# Patient Record
Sex: Male | Born: 1980
Health system: Southern US, Community
[De-identification: ages and names within clinical notes are randomized; demographics above are authoritative.]

## PROBLEM LIST (undated history)

## (undated) DIAGNOSIS — F329 Major depressive disorder, single episode, unspecified: Secondary | ICD-10-CM

## (undated) DIAGNOSIS — F419 Anxiety disorder, unspecified: Secondary | ICD-10-CM

## (undated) DIAGNOSIS — F32A Depression, unspecified: Secondary | ICD-10-CM

## (undated) HISTORY — PX: ABDOMINAL SURGERY: SHX537

---

## 2018-01-28 ENCOUNTER — Ambulatory Visit (HOSPITAL_COMMUNITY)
Admission: EM | Admit: 2018-01-28 | Discharge: 2018-01-28 | Disposition: A | Discharge status: Home / Self Care | Payer: BLUE CROSS/BLUE SHIELD | Attending: Family Medicine | Admitting: Family Medicine

## 2018-01-28 ENCOUNTER — Encounter (HOSPITAL_COMMUNITY): Payer: Self-pay | Admitting: Emergency Medicine

## 2018-01-28 DIAGNOSIS — H6123 Impacted cerumen, bilateral: Secondary | ICD-10-CM | POA: Diagnosis not present

## 2018-01-28 NOTE — Discharge Instructions (Signed)
Cerumen removed today. If continues to have some ringing/ear fullness, you can try over the counter flonase/zyrtec for possible eustachian tube dysfunction. Can follow up with ENT for further evaluation if continues to have ringing in the ears.

## 2018-01-28 NOTE — ED Provider Notes (Signed)
[facility]    CSN: [id number] Arrival date & time: 01/28/18  1658     History   Chief Complaint Chief Complaint  Patient presents with  . Otalgia    HPI Stephen Cruz is a 37 y.o. male.   37 year old male comes in for 1 week history of left ear fullness.  Has also had many years of intermittent tinnitus.  Denies ear drainage, pain.  Did try using cotton swabs to clean the ear once symptoms started.  Denies  recent swimming, travels.  Denies URI symptoms such as cough, congestion, rhinorrhea.  Tried over-the-counter eardrops without relief.     History reviewed. No pertinent past medical history.  There are no active problems to display for this patient.   Past Surgical History:  Procedure Laterality Date  . ABDOMINAL SURGERY         Home Medications    Prior to Admission medications   Not on File    Family History History reviewed. No pertinent family history.  Social History Social History   Tobacco Use  . Smoking status: Not on file  Substance Use Topics  . Alcohol use: Not Currently    Frequency: Never  . Drug use: Never     Allergies   Patient has no known allergies.   Review of Systems Review of Systems  Reason unable to perform ROS: See HPI as above.     Physical Exam Triage Vital Signs ED Triage Vitals [01/28/18 1734]  Enc Vitals Group     BP 128/81     Pulse Rate (!) 53     Resp 18     Temp 98.1 F (36.7 C)     Temp src      SpO2 100 %     Weight      Height      Head Circumference      Peak Flow      Pain Score      Pain Loc      Pain Edu?      Excl. in GC?    No data found.  Updated Vital Signs BP 128/81   Pulse (!) 53   Temp 98.1 F (36.7 C)   Resp 18   SpO2 100%   Physical Exam  Constitutional: He is oriented to person, place, and time. He appears well-developed and well-nourished. No distress.  HENT:  Head: Normocephalic and atraumatic.  Right Ear: External ear and ear canal normal.    Left Ear: External ear and ear canal normal.  No tenderness to palpation of bilateral tragus.  Bilateral cerumen impaction, TM not visible.  TM pearly gray, no mid ear effusion, erythema, bulging post irrigation.  Eyes: Pupils are equal, round, and reactive to light. Conjunctivae are normal.  Neurological: He is alert and oriented to person, place, and time.     UC Treatments / Results  Labs (all labs ordered are listed, but only abnormal results are displayed) Labs Reviewed - No data to display  EKG None Radiology No results found.  Procedures Procedures (including critical care time)  Medications Ordered in UC Medications - No data to display   Initial Impression / Assessment and Plan / UC Course  I have reviewed the triage vital signs and the nursing notes.  Pertinent labs & imaging results that were available during my care of the patient were reviewed by me and considered in my medical decision making (see chart for details).    Patient with improved symptoms  after cerumen removal.  Patient can try Flonase/Zyrtec for possible eustachian tube dysfunction for tinnitus.  Otherwise follow-up with ENT for further evaluation needed.  Final Clinical Impressions(s) / UC Diagnoses   Final diagnoses:  Bilateral impacted cerumen    ED Discharge Orders    None        Lurline IdolYu, Chino Sardo V, PA-C 01/28/18 1834

## 2018-01-28 NOTE — ED Triage Notes (Signed)
Pt c/o ringing in his L ear and it feeling "stuffed up" x1 week.

## 2018-06-23 DIAGNOSIS — H2000 Unspecified acute and subacute iridocyclitis: Secondary | ICD-10-CM | POA: Diagnosis not present

## 2018-06-28 DIAGNOSIS — S61310A Laceration without foreign body of right index finger with damage to nail, initial encounter: Secondary | ICD-10-CM | POA: Diagnosis not present

## 2018-07-01 DIAGNOSIS — H2000 Unspecified acute and subacute iridocyclitis: Secondary | ICD-10-CM | POA: Diagnosis not present

## 2018-07-08 DIAGNOSIS — H2000 Unspecified acute and subacute iridocyclitis: Secondary | ICD-10-CM | POA: Diagnosis not present

## 2018-07-15 DIAGNOSIS — H2000 Unspecified acute and subacute iridocyclitis: Secondary | ICD-10-CM | POA: Diagnosis not present

## 2018-08-12 DIAGNOSIS — H2000 Unspecified acute and subacute iridocyclitis: Secondary | ICD-10-CM | POA: Diagnosis not present

## 2018-08-20 DIAGNOSIS — K5900 Constipation, unspecified: Secondary | ICD-10-CM | POA: Diagnosis not present

## 2018-09-01 ENCOUNTER — Ambulatory Visit
Admission: RE | Admit: 2018-09-01 | Discharge: 2018-09-01 | Disposition: A | Discharge status: Home / Self Care | Payer: BLUE CROSS/BLUE SHIELD | Source: Ambulatory Visit | Attending: Gastroenterology | Admitting: Gastroenterology

## 2018-09-01 ENCOUNTER — Other Ambulatory Visit: Payer: Self-pay | Admitting: Gastroenterology

## 2018-09-01 DIAGNOSIS — K219 Gastro-esophageal reflux disease without esophagitis: Secondary | ICD-10-CM | POA: Diagnosis not present

## 2018-09-01 DIAGNOSIS — Z8719 Personal history of other diseases of the digestive system: Secondary | ICD-10-CM

## 2018-09-01 DIAGNOSIS — R140 Abdominal distension (gaseous): Secondary | ICD-10-CM | POA: Diagnosis not present

## 2018-09-01 DIAGNOSIS — R198 Other specified symptoms and signs involving the digestive system and abdomen: Secondary | ICD-10-CM | POA: Diagnosis not present

## 2018-09-01 DIAGNOSIS — K5900 Constipation, unspecified: Secondary | ICD-10-CM | POA: Diagnosis not present

## 2018-09-03 ENCOUNTER — Emergency Department (HOSPITAL_COMMUNITY): Payer: BLUE CROSS/BLUE SHIELD

## 2018-09-03 ENCOUNTER — Other Ambulatory Visit: Payer: Self-pay

## 2018-09-03 ENCOUNTER — Emergency Department (HOSPITAL_COMMUNITY)
Admission: EM | Admit: 2018-09-03 | Discharge: 2018-09-03 | Disposition: A | Discharge status: Home / Self Care | Payer: BLUE CROSS/BLUE SHIELD | Attending: Emergency Medicine | Admitting: Emergency Medicine

## 2018-09-03 DIAGNOSIS — F419 Anxiety disorder, unspecified: Secondary | ICD-10-CM | POA: Insufficient documentation

## 2018-09-03 DIAGNOSIS — R1013 Epigastric pain: Secondary | ICD-10-CM | POA: Diagnosis not present

## 2018-09-03 DIAGNOSIS — R52 Pain, unspecified: Secondary | ICD-10-CM | POA: Diagnosis not present

## 2018-09-03 DIAGNOSIS — R110 Nausea: Secondary | ICD-10-CM | POA: Diagnosis not present

## 2018-09-03 DIAGNOSIS — R61 Generalized hyperhidrosis: Secondary | ICD-10-CM | POA: Diagnosis not present

## 2018-09-03 DIAGNOSIS — R002 Palpitations: Secondary | ICD-10-CM | POA: Diagnosis not present

## 2018-09-03 DIAGNOSIS — R072 Precordial pain: Secondary | ICD-10-CM

## 2018-09-03 DIAGNOSIS — M79603 Pain in arm, unspecified: Secondary | ICD-10-CM | POA: Diagnosis not present

## 2018-09-03 DIAGNOSIS — M79602 Pain in left arm: Secondary | ICD-10-CM | POA: Insufficient documentation

## 2018-09-03 DIAGNOSIS — R079 Chest pain, unspecified: Secondary | ICD-10-CM | POA: Diagnosis not present

## 2018-09-03 LAB — BASIC METABOLIC PANEL
ANION GAP: 8 (ref 5–15)
BUN: 12 mg/dL (ref 6–20)
CALCIUM: 10 mg/dL (ref 8.9–10.3)
CO2: 27 mmol/L (ref 22–32)
CREATININE: 1.06 mg/dL (ref 0.61–1.24)
Chloride: 105 mmol/L (ref 98–111)
Glucose, Bld: 104 mg/dL — ABNORMAL HIGH (ref 70–99)
Potassium: 3.6 mmol/L (ref 3.5–5.1)
SODIUM: 140 mmol/L (ref 135–145)

## 2018-09-03 LAB — CBC
HCT: 44.8 % (ref 39.0–52.0)
Hemoglobin: 14.8 g/dL (ref 13.0–17.0)
MCH: 29.5 pg (ref 26.0–34.0)
MCHC: 33 g/dL (ref 30.0–36.0)
MCV: 89.4 fL (ref 80.0–100.0)
NRBC: 0 % (ref 0.0–0.2)
PLATELETS: 245 10*3/uL (ref 150–400)
RBC: 5.01 MIL/uL (ref 4.22–5.81)
RDW: 11.9 % (ref 11.5–15.5)
WBC: 7.2 10*3/uL (ref 4.0–10.5)

## 2018-09-03 LAB — I-STAT TROPONIN, ED
TROPONIN I, POC: 0 ng/mL (ref 0.00–0.08)
TROPONIN I, POC: 0 ng/mL (ref 0.00–0.08)

## 2018-09-03 NOTE — ED Provider Notes (Signed)
[facility] EMERGENCY DEPARTMENT Provider Note   CSN: [id number] Arrival date & time: 09/03/18  0159     History   Chief Complaint Chief Complaint  Patient presents with  . Chest Pain    HPI Gatlin Kittell is a 37 y.o. male.  The history is provided by the patient.  Chest Pain   This is a new problem. The current episode started 1 to 2 hours ago. The problem occurs constantly. The problem has been gradually improving. The pain is present in the substernal region. The pain is moderate. The quality of the pain is described as burning. The pain radiates to the epigastrium. Associated symptoms include abdominal pain, diaphoresis, nausea and palpitations. Pertinent negatives include no shortness of breath, no syncope and no vomiting. He has tried nothing for the symptoms.  Pertinent negatives for past medical history include no CAD and no PE.   PT Presents with chest and abdominal pain.  He reports waking up with abdominal and chest burning.  He reports at times he has pain in his left arm.  He has had these episodes intermittent for several years, but tonight's episode seems worse and he had mild diaphoresis.  He reports it would usually improve with time.  He just recently got started on a PPI.  He reports about 2 years ago he had a stress test that was negative, but this is when he lived in [place] He has no other significant medical conditions.  PMH-GERD, anxiety Soc hx - nonsmoker Past Surgical History:  Procedure Laterality Date  . ABDOMINAL SURGERY          Home Medications    Prior to Admission medications   Not on File    Family History No family history on file.  Social History Social History   Tobacco Use  . Smoking status: Not on file  Substance Use Topics  . Alcohol use: Not Currently    Frequency: Never  . Drug use: Never     Allergies   Patient has no known allergies.   Review of Systems Review of Systems  Constitutional:  Positive for diaphoresis.  Respiratory: Negative for shortness of breath.   Cardiovascular: Positive for chest pain and palpitations. Negative for syncope.  Gastrointestinal: Positive for abdominal pain and nausea. Negative for vomiting.  Neurological: Negative for syncope.  Psychiatric/Behavioral: The patient is nervous/anxious.   All other systems reviewed and are negative.    Physical Exam Updated Vital Signs BP 127/82   Pulse 73   Temp 97.6 F (36.4 C) (Oral)   Resp 16   Ht 1.778 m (5\' 10" )   Wt 79.4 kg   SpO2 100%   BMI 25.11 kg/m   Physical Exam CONSTITUTIONAL: Well developed/well nourished HEAD: Normocephalic/atraumatic EYES: EOMI/PERRL ENMT: Mucous membranes moist NECK: supple no meningeal signs SPINE/BACK:entire spine nontender CV: S1/S2 noted, no murmurs/rubs/gallops noted LUNGS: Lungs are clear to auscultation bilaterally, no apparent distress ABDOMEN: soft, nontender, no rebound or guarding, bowel sounds noted throughout abdomen GU:no cva tenderness NEURO: Pt is awake/alert/appropriate, moves all extremitiesx4.  No facial droop.   EXTREMITIES: pulses normal/equal, full ROM, no LE edema or tenderness SKIN: warm, color normal PSYCH: anxious   ED Treatments / Results  Labs (all labs ordered are listed, but only abnormal results are displayed) Labs Reviewed  BASIC METABOLIC PANEL - Abnormal; Notable for the following components:      Result Value   Glucose, Bld 104 (*)    All other components within normal  limits  CBC  I-STAT TROPONIN, ED  I-STAT TROPONIN, ED    EKG EKG Interpretation  Date/Time:  Saturday September 03 2018 02:07:45 EST Ventricular Rate:  73 PR Interval:    QRS Duration: 108 QT Interval:  385 QTC Calculation: 425 R Axis:   87 Text Interpretation:  Sinus arrhythmia Confirmed by Zadie RhineWickline, Daizy Outen ([id number]) on 09/03/2018 2:24:08 AM   Radiology Dg Abd 2 Views  Result Date: 09/02/2018 CLINICAL DATA:  Nausea, constipation. History  of small-bowel obstruction. EXAM: ABDOMEN - 2 VIEW COMPARISON:  None. FINDINGS: Bowel gas pattern is nonobstructive. No plain film evidence of constipation No evidence of soft tissue mass or abnormal fluid collection. No evidence of free intraperitoneal air. No evidence of renal or ureteral calculi. Lung bases appear clear. Osseous structures are unremarkable. IMPRESSION: Negative. Nonobstructive bowel gas pattern. No radiographic evidence of constipation. Electronically Signed   By: Bary RichardStan  Maynard M.D.   On: 09/02/2018 09:07    Procedures Procedures (including critical care time)  Medications Ordered in ED Medications - No data to display   Initial Impression / Assessment and Plan / ED Course  I have reviewed the triage vital signs and the nursing notes.  Pertinent labs & imaging results that were available during my care of the patient were reviewed by me and considered in my medical decision making (see chart for details).     2:28 AM HEART Score less than 3 Imaging/labs pending at this time 5:05 AM Repeat troponin negative.  X-ray negative.  EKG unremarkable.  My suspicion for ACS/PE is low.  Will refer to cardiology for palpitations. Patient well-appearing and feels improved. Final Clinical Impressions(s) / ED Diagnoses   Final diagnoses:  Precordial pain  Palpitations    ED Discharge Orders    None       Zadie RhineWickline, Roseana Rhine, MD 09/03/18 [phone]

## 2018-09-03 NOTE — Discharge Instructions (Addendum)

## 2018-09-03 NOTE — ED Triage Notes (Addendum)
Pt reports centralized chest pain that goes into his stomach described as buring.  Pt reports the pain woke him up and he also has left arm pain.  denies sob, dizziness or sweating.  Reports this has been going on "for years but is really bad."

## 2018-09-03 NOTE — ED Notes (Signed)
ED Provider at bedside. 

## 2018-09-21 ENCOUNTER — Encounter (HOSPITAL_COMMUNITY): Payer: Self-pay

## 2018-09-21 ENCOUNTER — Emergency Department (HOSPITAL_COMMUNITY)
Admission: EM | Admit: 2018-09-21 | Discharge: 2018-09-21 | Disposition: A | Discharge status: Home / Self Care | Payer: BLUE CROSS/BLUE SHIELD | Attending: Emergency Medicine | Admitting: Emergency Medicine

## 2018-09-21 ENCOUNTER — Emergency Department (HOSPITAL_COMMUNITY): Payer: BLUE CROSS/BLUE SHIELD

## 2018-09-21 DIAGNOSIS — R079 Chest pain, unspecified: Secondary | ICD-10-CM | POA: Diagnosis not present

## 2018-09-21 LAB — BASIC METABOLIC PANEL
ANION GAP: 6 (ref 5–15)
BUN: 14 mg/dL (ref 6–20)
CHLORIDE: 105 mmol/L (ref 98–111)
CO2: 29 mmol/L (ref 22–32)
Calcium: 9.5 mg/dL (ref 8.9–10.3)
Creatinine, Ser: 1.22 mg/dL (ref 0.61–1.24)
GFR calc Af Amer: >60 mL/min (ref >60)
GFR calc non Af Amer: >60 mL/min (ref >60)
GLUCOSE: 112 mg/dL — AB (ref 70–99)
POTASSIUM: 3.8 mmol/L (ref 3.5–5.1)
Sodium: 140 mmol/L (ref 135–145)

## 2018-09-21 LAB — I-STAT TROPONIN, ED
TROPONIN I, POC: 0.01 ng/mL (ref 0.00–0.08)
Troponin i, poc: 0 ng/mL (ref 0.00–0.08)

## 2018-09-21 LAB — CBC
HEMATOCRIT: 43.3 % (ref 39.0–52.0)
HEMOGLOBIN: 14.1 g/dL (ref 13.0–17.0)
MCH: 29.1 pg (ref 26.0–34.0)
MCHC: 32.6 g/dL (ref 30.0–36.0)
MCV: 89.5 fL (ref 80.0–100.0)
NRBC: 0 % (ref 0.0–0.2)
Platelets: 269 10*3/uL (ref 150–400)
RBC: 4.84 MIL/uL (ref 4.22–5.81)
RDW: 11.9 % (ref 11.5–15.5)
WBC: 7.5 10*3/uL (ref 4.0–10.5)

## 2018-09-21 NOTE — ED Notes (Signed)
Pt stable and ambulatory for discharge, states understanding follow up.  

## 2018-09-21 NOTE — ED Triage Notes (Signed)
Pt states that he began to have CP and L arm pain after lifting jugs of water today. He also became nauseated.

## 2018-09-21 NOTE — Discharge Instructions (Signed)
Follow up with cardiology as scheduled this Friday   Return to ER if you have worse chest pain, trouble breathing

## 2018-09-22 NOTE — ED Provider Notes (Signed)
[facility] EMERGENCY DEPARTMENT Provider Note   CSN: [id number] Arrival date & time: 09/21/18  1923     History   Chief Complaint Chief Complaint  Patient presents with  . Chest Pain    HPI Stephen Cruz is a 37 y.o. male here with palpitations, left sided chest pain, arm pain. Patient was lifting some water jugs this afternoon and then began having left sided chest pain that radiate down to the left arm. Felt nauseated. Pain is crampy in nature. Patient had some associated palpitations. Of note, patient was seen in the ED several weeks ago for palpitations and chest pain and troponins were negative. Patient is scheduled to see cardiology in 2 days for further evaluation and possible Holter monitor. He had negative stress test 2 years ago. Denies any recent travel or hx of blood clots or CAD.   The history is provided by the patient.    History reviewed. No pertinent past medical history.  There are no active problems to display for this patient.   Past Surgical History:  Procedure Laterality Date  . ABDOMINAL SURGERY          Home Medications    Prior to Admission medications   Not on File    Family History No family history on file.  Social History Social History   Tobacco Use  . Smoking status: Not on file  Substance Use Topics  . Alcohol use: Not Currently    Frequency: Never  . Drug use: Never     Allergies   Patient has no known allergies.   Review of Systems Review of Systems  Cardiovascular: Positive for chest pain.  All other systems reviewed and are negative.    Physical Exam Updated Vital Signs BP 117/70   Pulse (!) 56   Temp 97.9 F (36.6 C) (Oral)   Resp 15   SpO2 100%   Physical Exam  Constitutional: He is oriented to person, place, and time. He appears well-developed and well-nourished.  Slightly anxious   HENT:  Head: Normocephalic.  Eyes: Pupils are equal, round, and reactive to light. EOM are  normal.  Neck: Normal range of motion. Neck supple.  Cardiovascular: Normal rate, regular rhythm, intact distal pulses and normal pulses.  Pulmonary/Chest: Effort normal and breath sounds normal.  No obvious reproducible tenderness   Abdominal: Soft. Bowel sounds are normal.  Musculoskeletal: Normal range of motion.       Right lower leg: Normal. He exhibits no tenderness and no edema.       Left lower leg: Normal. He exhibits no tenderness and no edema.  Neurological: He is alert and oriented to person, place, and time.  Skin: Skin is warm. Capillary refill takes less than 2 seconds.  Psychiatric: He has a normal mood and affect. His behavior is normal.  Nursing note and vitals reviewed.    ED Treatments / Results  Labs (all labs ordered are listed, but only abnormal results are displayed) Labs Reviewed  BASIC METABOLIC PANEL - Abnormal; Notable for the following components:      Result Value   Glucose, Bld 112 (*)    All other components within normal limits  CBC  I-STAT TROPONIN, ED  I-STAT TROPONIN, ED    EKG EKG Interpretation  Date/Time:  Wednesday September 21 2018 19:34:59 EST Ventricular Rate:  75 PR Interval:  140 QRS Duration: 86 QT Interval:  368 QTC Calculation: 410 R Axis:   88 Text Interpretation:  Normal sinus rhythm  with sinus arrhythmia Nonspecific ST abnormality Abnormal ECG No significant change since last tracing Confirmed by Richardean CanalYao, Charlie Seda H [phone]([id number]) on 09/21/2018 8:56:19 PM Also confirmed by Richardean CanalYao, Kaileigh Viswanathan H [phone]), editor Elita QuickWatlington, [place] [phone])  on 09/22/2018 7:31:49 AM   Radiology Dg Chest 2 View  Result Date: 09/21/2018 CLINICAL DATA:  37 year old male with chest pain. EXAM: CHEST - 2 VIEW COMPARISON:  Chest radiograph dated 09/03/2018 FINDINGS: The heart size and mediastinal contours are within normal limits. Both lungs are clear. The visualized skeletal structures are unremarkable. IMPRESSION: No active cardiopulmonary disease. Electronically Signed    By: Elgie CollardArash  Radparvar M.D.   On: 09/21/2018 21:10    Procedures Procedures (including critical care time)  Medications Ordered in ED Medications - No data to display   Initial Impression / Assessment and Plan / ED Course  I have reviewed the triage vital signs and the nursing notes.  Pertinent labs & imaging results that were available during my care of the patient were reviewed by me and considered in my medical decision making (see chart for details).    Stephen Cruz is a 37 y.o. male here with left sided chest pain, arm pain after lifting water jugs. I think likely muscle strain but pain is not reproducible. Patient also has hx of reflux as well with similar symptoms. Low risk for ACS and patient had a r/o ACS several weeks ago in the ED and has cardiology appointment. I doubt PE or dissection. Will get labs, trop x 2, CXR.   10:30 pm Patient's labs, CXR unremarkable. Trop neg x 2. I offered NSAIDs but he states that he has reflux and it will upset his stomach. Has cardiology follow up in 2 days. Stable for discharge.    Final Clinical Impressions(s) / ED Diagnoses   Final diagnoses:  Chest pain, unspecified type    ED Discharge Orders    None       Charlynne PanderYao, Rogue Pautler Hsienta, MD 09/22/18 2027

## 2018-09-23 ENCOUNTER — Encounter: Payer: Self-pay | Admitting: Cardiology

## 2018-09-23 ENCOUNTER — Ambulatory Visit (INDEPENDENT_AMBULATORY_CARE_PROVIDER_SITE_OTHER): Payer: BLUE CROSS/BLUE SHIELD | Admitting: Cardiology

## 2018-09-23 VITALS — BP 128/56 | HR 66 | Ht 70.0 in | Wt 167.8 lb

## 2018-09-23 DIAGNOSIS — R002 Palpitations: Secondary | ICD-10-CM | POA: Insufficient documentation

## 2018-09-23 DIAGNOSIS — R0789 Other chest pain: Secondary | ICD-10-CM

## 2018-09-23 NOTE — Patient Instructions (Signed)
Medication Instructions:  Your physician recommends that you continue on your current medications as directed. Please refer to the Current Medication list given to you today.  If you need a refill on your cardiac medications before your next appointment, please call your pharmacy.   Lab work: Your physician recommends that you return for lab work today: TSH If you have labs (blood work) drawn today and your tests are completely normal, you will receive your results only by: Marland Kitchen MyChart Message (if you have MyChart) OR . A paper copy in the mail If you have any lab test that is abnormal or we need to change your treatment, we will call you to review the results.  Testing/Procedures: Your physician has requested that you have a stress echocardiogram. For further information please visit [email or web address] Please follow instruction sheet as given.  Your physician has recommended that you wear a holter monitor. Holter monitors are medical devices that record the heart's electrical activity. Doctors most often use these monitors to diagnose arrhythmias. Arrhythmias are problems with the speed or rhythm of the heartbeat. The monitor is a small, portable device. You can wear one while you do your normal daily activities. This is usually used to diagnose what is causing palpitations/syncope (passing out). Wear for 14 days.     Follow-Up: At [facility], you and your health needs are our priority.  As part of our continuing mission to provide you with exceptional heart care, we have created designated Provider Care Teams.  These Care Teams include your primary Cardiologist (physician) and Advanced Practice Providers (APPs -  Physician Assistants and Nurse Practitioners) who all work together to provide you with the care you need, when you need it. You will need a follow up appointment in 6 months.  Please call our office 2 months in advance to schedule this appointment.  You may see No primary care  provider on file. or another member of our [employer] in [place]: Gypsy Balsam, MD . Norman Herrlich, MD  Any Other Special Instructions Will Be Listed Below (If Applicable).   Exercise Stress Echocardiogram An exercise stress echocardiogram is a test to check how well your heart is working. This test uses sound waves (ultrasound) and a computer to make images of your heart before and after exercise. Ultrasound images that are taken before you exercise (your resting echocardiogram) will show how much blood is getting to your heart muscle and how well your heart muscle and heart valves are functioning. During the next part of this test, you will walk on a treadmill or ride a stationary bike to see how exercise affects your heart. While you exercise, the electrical activity of your heart will be monitored with an electrocardiogram (ECG). Your blood pressure will also be monitored. You may have this test if you:  Have chest pain or other symptoms of a heart problem.  Recently had a heart attack or heart surgery.  Have heart valve problems.  Have a condition that causes narrowing of the blood vessels that supply your heart (coronary artery disease).  Have a high risk of heart disease and are starting a new exercise program.  Have a high risk of heart disease and need to have major surgery.  Tell a health care provider about:  Any allergies you have.  All medicines you are taking, including vitamins, herbs, eye drops, creams, and over-the-counter medicines.  Any problems you or family members have had with anesthetic medicines.  Any blood disorders you  have.  Any surgeries you have had.  Any medical conditions you have.  Whether you are pregnant or may be pregnant. What are the risks? Generally, this is a safe procedure. However, problems may occur, including:  Chest pain.  Dizziness or light-headedness.  Shortness of breath.  Increased or irregular  heartbeat (palpitations).  Nausea or vomiting.  Heart attack (very rare).  What happens before the procedure?  Follow instructions from your health care provider about eating or drinking restrictions. You may be asked to avoid all forms of caffeine for 24 hours before your procedure, or as told by your health care provider.  Ask your health care provider about changing or stopping your regular medicines. This is especially important if you are taking diabetes medicines or blood thinners.  If you use an inhaler, bring it with you to the test.  Wear loose, comfortable clothing and walking shoes.  Do notuse any products that contain nicotine or tobacco, such as cigarettes and e-cigarettes, for 4 hours before the test or as told by your health care provider. If you need help quitting, ask your health care provider. What happens during the procedure?  You will take off your clothes from the waist up and put on a hospital gown.  A technician will place electrodes on your chest.  A blood pressure cuff will be placed on your arm.  You will lie down on a table for an ultrasound exam before you exercise. Gel will be rubbed on your chest, and a handheld device (transducer) will be pressed against your chest and moved over your heart.  Then, you will start exercising by walking on a treadmill or pedaling a stationary bicycle.  Your blood pressure and heart rhythm will be monitored while you exercise.  The exercise will gradually get harder or faster.  You will exercise until: ? Your heart reaches a target level. ? You are too tired to continue. ? You cannot continue because of chest pain, weakness, or dizziness.  You will have another ultrasound exam after you stop exercising. The procedure may vary among health care providers and hospitals. What happens after the procedure?  Your heart rate and blood pressure will be monitored until they return to your normal levels. Summary  An  exercise stress echocardiogram is a test that uses ultrasound to check how well your heart works before and after exercise.  Before the test, follow instructions from your health care provider about stopping medications, avoiding nicotine and tobacco, and avoiding certain foods and drinks.  During the test, your blood pressure and heart rhythm will be monitored while you exercise on a treadmill or stationary bicycle. This information is not intended to replace advice given to you by your health care provider. Make sure you discuss any questions you have with your health care provider. Document Released: 10/09/2004 Document Revised: 05/27/2016 Document Reviewed: 05/27/2016 Elsevier Interactive Patient Education  2018 [employer] Inc.    Holter Monitoring A Holter monitor is a small device that is used to detect abnormal heart rhythms. It clips to your clothing and is connected by wires to flat, sticky disks (electrodes) that attach to your chest. It is worn continuously for 24-48 hours. Follow these instructions at home:  Wear your Holter monitor at all times, even while exercising and sleeping, for as long as directed by your health care provider.  Make sure that the Holter monitor is safely clipped to your clothing or close to your body as recommended by your health care provider.  Do not get the monitor or wires wet.  Do not put body lotion or moisturizer on your chest.  Keep your skin clean.  Keep a diary of your daily activities, such as walking and doing chores. If you feel that your heartbeat is abnormal or that your heart is fluttering or skipping a beat: ? Record what you are doing when it happens. ? Record what time of day the symptoms occur.  Return your Holter monitor as directed by your health care provider.  Keep all follow-up visits as directed by your health care provider. This is important. Get help right away if:  You feel lightheaded or you faint.  You have trouble  breathing.  You feel pain in your chest, upper arm, or jaw.  You feel sick to your stomach and your skin is pale, cool, or damp.  You heartbeat feels unusual or abnormal. This information is not intended to replace advice given to you by your health care provider. Make sure you discuss any questions you have with your health care provider. Document Released: 07/03/2004 Document Revised: 03/12/2016 Document Reviewed: 05/14/2014 Elsevier Interactive Patient Education  [employer]2018 [employer] Inc.

## 2018-09-23 NOTE — Progress Notes (Signed)
Cardiology Office Note:    Date:  09/23/2018   ID:  Stephen Cruz, DOB 12/09/1980, MRN [id number]  PCP:  Patient, No Pcp Per  Cardiologist:  Garwin Brothersajan R Jevonte Clanton, MD   Referring MD: No ref. provider found    ASSESSMENT:    1. Palpitations   2. Chest discomfort    PLAN:    In order of problems listed above:  1. I discussed my findings with the patient at extensive length and primary prevention stressed.  I reassured the patient.  He brought some lab work with him and I reviewed them. 2. Patient's TSH will be checked today in view of his palpitations and his symptoms. 3. He will undergo exercise stress echo and to understand his symptoms.  He will also undergo 14-day event monitoring to understand his palpitations and their significance.  He knows to go to the nearest emergency room for any significant concerns. 4. Patient will be seen in follow-up appointment in 6 months or earlier if the patient has any concerns    Medication Adjustments/Labs and Tests Ordered: Current medicines are reviewed at length with the patient today.  Concerns regarding medicines are outlined above.  No orders of the defined types were placed in this encounter.  No orders of the defined types were placed in this encounter.    History of Present Illness:    Stephen Cruz is a 37 y.o. male who is being seen today for the evaluation of palpitations and chest discomfort.  Patient is a pleasant 37 year old male.  He mentions to me that he has issues with anxiety.  He has history of irritable bowel syndrome and acid reflux disease.  He was referred here for palpitations.  He tells me that he has his palpitations on and off and is been experiencing them since young age.  But recently he mentions to me that he has been having this about once or twice a week and not of concern to him.  No orthopnea PND or syncope.  He occasionally has chest discomfort again not related to exertion and this has no radiation to the  neck or to the arms.  He is not aware of his cholesterol.  At the time of my evaluation, the patient is alert awake oriented and in no distress.  History reviewed. No pertinent past medical history.  Past Surgical History:  Procedure Laterality Date  . ABDOMINAL SURGERY      Current Medications: Current Meds  Medication Sig  . dicyclomine (BENTYL) 10 MG capsule Take 1 capsule by mouth 3 (three) times daily.  . pantoprazole (PROTONIX) 40 MG tablet Take 40 mg by mouth daily.     Allergies:   Patient has no known allergies.   Social History   Socioeconomic History  . Marital status: Single    Spouse name: Not on file  . Number of children: Not on file  . Years of education: Not on file  . Highest education level: Not on file  Occupational History  . Not on file  Social Needs  . Financial resource strain: Not on file  . Food insecurity:    Worry: Not on file    Inability: Not on file  . Transportation needs:    Medical: Not on file    Non-medical: Not on file  Tobacco Use  . Smoking status: Never Smoker  . Smokeless tobacco: Never Used  Substance and Sexual Activity  . Alcohol use: Not Currently    Frequency: Never  . Drug use:  Never  . Sexual activity: Never  Lifestyle  . Physical activity:    Days per week: Not on file    Minutes per session: Not on file  . Stress: Not on file  Relationships  . Social connections:    Talks on phone: Not on file    Gets together: Not on file    Attends religious service: Not on file    Active member of club or organization: Not on file    Attends meetings of clubs or organizations: Not on file    Relationship status: Not on file  Other Topics Concern  . Not on file  Social History Narrative  . Not on file     Family History: The patient's family history is not on file.  ROS:   Please see the history of present illness.    All other systems reviewed and are negative.  EKGs/Labs/Other Studies Reviewed:    The  following studies were reviewed today: EKG reveals sinus rhythm and nonspecific ST changes.   Recent Labs: 09/21/2018: BUN 14; Creatinine, Ser 1.22; Hemoglobin 14.1; Platelets 269; Potassium 3.8; Sodium 140  Recent Lipid Panel No results found for: CHOL, TRIG, HDL, CHOLHDL, VLDL, LDLCALC, LDLDIRECT  Physical Exam:    VS:  BP (!) 128/56 (BP Location: Right Arm, Patient Position: Sitting, Cuff Size: Normal)   Pulse 66   Ht 5\' 10"  (1.778 m)   Wt 167 lb 12.8 oz (76.1 kg)   SpO2 98%   BMI 24.08 kg/m     Wt Readings from Last 3 Encounters:  09/23/18 167 lb 12.8 oz (76.1 kg)  09/03/18 175 lb (79.4 kg)     GEN: Patient is in no acute distress HEENT: Normal NECK: No JVD; No carotid bruits LYMPHATICS: No lymphadenopathy CARDIAC: S1 S2 regular, 2/6 systolic murmur at the apex. RESPIRATORY:  Clear to auscultation without rales, wheezing or rhonchi  ABDOMEN: Soft, non-tender, non-distended MUSCULOSKELETAL:  No edema; No deformity  SKIN: Warm and dry NEUROLOGIC:  Alert and oriented x 3 PSYCHIATRIC:  Normal affect    Signed, Garwin Brothers, MD  09/23/2018 10:00 AM    [facility]

## 2018-09-24 LAB — TSH: TSH: 0.856 u[IU]/mL (ref 0.450–4.500)

## 2018-09-29 ENCOUNTER — Ambulatory Visit: Payer: BLUE CROSS/BLUE SHIELD

## 2018-09-29 ENCOUNTER — Ambulatory Visit (HOSPITAL_BASED_OUTPATIENT_CLINIC_OR_DEPARTMENT_OTHER)
Admission: RE | Admit: 2018-09-29 | Discharge: 2018-09-29 | Disposition: A | Discharge status: Home / Self Care | Payer: BLUE CROSS/BLUE SHIELD | Source: Ambulatory Visit | Attending: Cardiology | Admitting: Cardiology

## 2018-09-29 DIAGNOSIS — R002 Palpitations: Secondary | ICD-10-CM | POA: Diagnosis not present

## 2018-09-29 DIAGNOSIS — R0789 Other chest pain: Secondary | ICD-10-CM | POA: Diagnosis not present

## 2018-09-29 NOTE — Progress Notes (Signed)
  Echocardiogram Echocardiogram Stress Test has been performed.  Stephen Cruz 09/29/2018, 11:15 AM

## 2018-10-04 ENCOUNTER — Other Ambulatory Visit (HOSPITAL_BASED_OUTPATIENT_CLINIC_OR_DEPARTMENT_OTHER): Payer: BLUE CROSS/BLUE SHIELD

## 2018-10-10 DIAGNOSIS — H2000 Unspecified acute and subacute iridocyclitis: Secondary | ICD-10-CM | POA: Diagnosis not present

## 2018-10-17 DIAGNOSIS — R002 Palpitations: Secondary | ICD-10-CM | POA: Diagnosis not present

## 2018-10-21 DIAGNOSIS — H2000 Unspecified acute and subacute iridocyclitis: Secondary | ICD-10-CM | POA: Diagnosis not present

## 2018-10-27 ENCOUNTER — Ambulatory Visit: Payer: BLUE CROSS/BLUE SHIELD | Admitting: Family Medicine

## 2018-10-27 ENCOUNTER — Encounter: Payer: Self-pay | Admitting: Family Medicine

## 2018-10-27 VITALS — BP 126/70 | HR 62 | Temp 97.5°F | Ht 70.0 in | Wt 167.2 lb

## 2018-10-27 DIAGNOSIS — F418 Other specified anxiety disorders: Secondary | ICD-10-CM

## 2018-10-27 DIAGNOSIS — K589 Irritable bowel syndrome without diarrhea: Secondary | ICD-10-CM | POA: Diagnosis not present

## 2018-10-27 DIAGNOSIS — Z0000 Encounter for general adult medical examination without abnormal findings: Secondary | ICD-10-CM | POA: Diagnosis not present

## 2018-10-27 MED ORDER — VENLAFAXINE HCL ER 37.5 MG PO CP24: 37.5000 mg | ORAL_CAPSULE | Freq: Every day | ORAL | 0 refills | Status: DC

## 2018-10-27 NOTE — Progress Notes (Signed)
Established Patient Office Visit  Subjective:  Patient ID: Stephen Cruz, male    DOB: 10/05/1981  Age: 38 y.o. MRN: [id number]  CC:  Chief Complaint  Patient presents with  . Establish Care    HPI Stephen Cruz presents for establishment of care and a discussion of anxiety and depression.  He has been feeling sad anxious since May of this past year.  This roughly coincides with the break-up of his significant other that happen around that time.  He actually moved in this area from [place] a few years ago in order to be with this person.  [place] members [place] in the [place].  Patient currently lives alone.  He works for [employer].  He has a degree in biology.  Does not smoke drink or use illicit drugs.  He is currently not exercising.  His father has a history of bladder cancer.  He is mother is healthy.  Patient is currently under GI care for irritable bowel syndrome.  He is currently being treated by an ophthalmologist for iritis and OS.  Longstanding history of anxiety with some depression.  He was given Zoloft when he was in college this did not agree with him.  He has been depressed and anxious.  Admits to being sad and blue at times has had trouble getting motivated.  Fleeting thoughts that he would be better off not here but is now never seriously thought about hurting himself and is never made a plan for self-harm.  History reviewed. No pertinent past medical history.  Past Surgical History:  Procedure Laterality Date  . ABDOMINAL SURGERY      History reviewed. No pertinent family history.  Social History   Socioeconomic History  . Marital status: Single    Spouse name: Not on file  . Number of children: Not on file  . Years of education: Not on file  . Highest education level: Not on file  Occupational History  . Not on file  Social Needs  . Financial resource strain: Not on file  . Food insecurity:    Worry:  Not on file    Inability: Not on file  . Transportation needs:    Medical: Not on file    Non-medical: Not on file  Tobacco Use  . Smoking status: Never Smoker  . Smokeless tobacco: Never Used  Substance and Sexual Activity  . Alcohol use: Not Currently    Frequency: Never  . Drug use: Never  . Sexual activity: Never  Lifestyle  . Physical activity:    Days per week: Not on file    Minutes per session: Not on file  . Stress: Not on file  Relationships  . Social connections:    Talks on phone: Not on file    Gets together: Not on file    Attends religious service: Not on file    Active member of club or organization: Not on file    Attends meetings of clubs or organizations: Not on file    Relationship status: Not on file  . Intimate partner violence:    Fear of current or ex partner: Not on file    Emotionally abused: Not on file    Physically abused: Not on file    Forced sexual activity: Not on file  Other Topics Concern  . Not on file  Social History Narrative  . Not on file    Outpatient Medications Prior to Visit  Medication Sig Dispense  Refill  . cyclopentolate (CYCLODRYL,CYCLOGYL) 1 % ophthalmic solution 1 drop once.    . pantoprazole (PROTONIX) 40 MG tablet Take 40 mg by mouth daily.    . prednisoLONE acetate (PRED FORTE) 1 % ophthalmic suspension 1 drop. Into affected eye.    . dicyclomine (BENTYL) 10 MG capsule Take 1 capsule by mouth 3 (three) times daily.  0   No facility-administered medications prior to visit.     No Known Allergies  ROS Review of Systems  Constitutional: Positive for unexpected weight change. Negative for fatigue and fever.  HENT: Negative.   Eyes: Positive for pain. Negative for photophobia and visual disturbance.  Respiratory: Negative.   Cardiovascular: Negative.   Gastrointestinal: Negative.   Endocrine: Negative for polyphagia and polyuria.  Genitourinary: Negative.   Musculoskeletal: Negative.   Skin: Negative for  pallor and rash.  Neurological: Negative for light-headedness, numbness and headaches.  Hematological: Does not bruise/bleed easily.  Psychiatric/Behavioral: Positive for dysphoric mood. Negative for self-injury and suicidal ideas. The patient is nervous/anxious.       Objective:    Physical Exam  BP 126/70   Pulse 62   Temp (!) 97.5 F (36.4 C) (Oral)   Ht 5\' 10"  (1.778 m)   Wt 167 lb 4 oz (75.9 kg)   SpO2 99%   BMI 24.00 kg/m  Wt Readings from Last 3 Encounters:  10/27/18 167 lb 4 oz (75.9 kg)  09/23/18 167 lb 12.8 oz (76.1 kg)  09/03/18 175 lb (79.4 kg)   BP Readings from Last 3 Encounters:  10/27/18 126/70  09/23/18 (!) 128/56  09/21/18 117/70   Guideline developer:  UpToDate (see UpToDate for funding source) Date Released: June 2014  Health Maintenance Due  Topic Date Due  . HIV Screening  01/18/1997  . INFLUENZA VACCINE  05/19/2018    There are no preventive care reminders to display for this patient.  Lab Results  Component Value Date   TSH 0.856 09/23/2018   Lab Results  Component Value Date   WBC 7.5 09/21/2018   HGB 14.1 09/21/2018   HCT 43.3 09/21/2018   MCV 89.5 09/21/2018   PLT 269 09/21/2018   Lab Results  Component Value Date   NA 140 09/21/2018   K 3.8 09/21/2018   CO2 29 09/21/2018   GLUCOSE 112 (H) 09/21/2018   BUN 14 09/21/2018   CREATININE 1.22 09/21/2018   CALCIUM 9.5 09/21/2018   ANIONGAP 6 09/21/2018   No results found for: CHOL No results found for: HDL No results found for: LDLCALC No results found for: TRIG No results found for: CHOLHDL No results found for: [id number]    Assessment & Plan:   Problem List Items Addressed This Visit      Digestive   Irritable bowel syndrome     Other   Depression with anxiety - Primary   Relevant Medications   venlafaxine XR (EFFEXOR-XR) 37.5 MG 24 hr capsule   Health care maintenance   Relevant Orders   CBC   Comprehensive metabolic panel   Lipid panel   Urinalysis, Routine  w reflex microscopic      Meds ordered this encounter  Medications  . venlafaxine XR (EFFEXOR-XR) 37.5 MG 24 hr capsule    Sig: Take 1 capsule (37.5 mg total) by mouth daily with breakfast. For one week and then increase to 2 each morning with breakfast.    Dispense:  60 capsule    Refill:  0    Follow-up: Return in about 1  month (around 11/27/2018).   Patient was given information on the anxiety and depression.  He was also given information on Effexor extended release tablets.  He will make an appointment for counseling through employee assistance at his work.  He will return fasting for above ordered blood work.

## 2018-10-27 NOTE — Patient Instructions (Addendum)
Irritable Bowel Syndrome, Adult  Irritable bowel syndrome (IBS) is a group of symptoms that affects the organs responsible for digestion (gastrointestinal or GI tract). IBS is not one specific disease. To regulate how the GI tract works, the body sends signals back and forth between the intestines and the brain. If you have IBS, there may be a problem with these signals. As a result, the GI tract does not function normally. The intestines may become more sensitive and overreact to certain things. This may be especially true when you eat certain foods or when you are under stress. There are four types of IBS. These may be determined based on the consistency of your stool (feces):  IBS with diarrhea.  IBS with constipation.  Mixed IBS.  Unsubtyped IBS. It is important to know which type of IBS you have. Certain treatments are more likely to be helpful for certain types of IBS. What are the causes? The exact cause of IBS is not known. What increases the risk? You may have a higher risk for IBS if you:  Are male.  Are younger than 72.  Have a family history of IBS.  Have a mental health condition, such as depression, anxiety, or post-traumatic stress disorder.  Have had a bacterial infection of your GI tract. What are the signs or symptoms? Symptoms of IBS vary from person to person. The main symptom is abdominal pain or discomfort. Other symptoms usually include one or more of the following:  Diarrhea, constipation, or both.  Abdominal swelling or bloating.  Feeling full after eating a small or regular-sized meal.  Frequent gas.  Mucus in the stool.  A feeling of having more stool left after a bowel movement. Symptoms tend to come and go. They may be triggered by stress, mental health conditions, or certain foods. How is this diagnosed? This condition may be diagnosed based on a physical exam, your medical history, and your symptoms. You may have tests, such as:  Blood  tests.  Stool test.  X-rays.  CT scan.  Colonoscopy. This is a procedure in which your GI tract is viewed with a long, thin, flexible tube. How is this treated? There is no cure for IBS, but treatment can help relieve symptoms. Treatment depends on the type of IBS you have, and may include:  Changes to your diet, such as: ? Avoiding foods that cause symptoms. ? Drinking more water. ? Following a low-FODMAP (fermentable oligosaccharides, disaccharides, monosaccharides, and polyols) diet for up to 6 weeks, or as told by your health care provider. FODMAPs are sugars that are hard for some people to digest. ? Eating more fiber. ? Eating medium-sized meals at the same times every day.  Medicines. These may include: ? Fiber supplements, if you have constipation. ? Medicine to control diarrhea (antidiarrheal medicines). ? Medicine to help control muscle tightening (spasms) in your GI tract (antispasmodic medicines). ? Medicines to help with mental health conditions, such as antidepressants or tranquilizers.  Talk therapy or counseling.  Working with a diet and nutrition specialist (dietitian) to help create a food plan that is right for you.  Managing your stress. Follow these instructions at home: Eating and drinking  Eat a healthy diet.  Eat medium-sized meals at about the same time every day. Do not eat large meals.  Gradually eat more fiber-rich foods. These include whole grains, fruits, and vegetables. This may be especially helpful if you have IBS with constipation.  Eat a diet low in FODMAPs.  Drink enough  fluid to keep your urine pale yellow.  Keep a journal of foods that seem to trigger symptoms.  Avoid foods and drinks that: ? Contain added sugar. ? Make your symptoms worse. Dairy products, caffeinated drinks, and carbonated drinks can make symptoms worse for some people. General instructions  Take over-the-counter and prescription medicines and supplements only  as told by your health care provider.  Get enough exercise. Do at least 150 minutes of moderate-intensity exercise each week.  Manage your stress. Getting enough sleep and exercise can help you manage stress.  Keep all follow-up visits as told by your health care provider and therapist. This is important. Alcohol Use  Do not drink alcohol if: ? Your health care provider tells you not to drink. ? You are pregnant, may be pregnant, or are planning to become pregnant.  If you drink alcohol, limit how much you have: ? 0-1 drink a day for women. ? 0-2 drinks a day for men.  Be aware of how much alcohol is in your drink. In the [place] one drink equals one typical bottle of beer (12 oz), one-half glass of wine (5 oz), or one shot of hard liquor (1 oz). Contact a health care provider if you have:  Constant pain.  Weight loss.  Difficulty or pain when swallowing.  Diarrhea that gets worse. Get help right away if you have:  Severe abdominal pain.  Fever.  Diarrhea with symptoms of dehydration, such as dizziness or dry mouth.  Bright red blood in your stool.  Stool that is black and tarry.  Abdominal swelling.  Vomiting that does not stop.  Blood in your vomit. Summary  Irritable bowel syndrome (IBS) is not one specific disease. It is a group of symptoms that affects digestion.  Your intestines may become more sensitive and overreact to certain things. This may be especially true when you eat certain foods or when you are under stress.  There is no cure for IBS, but treatment can help relieve symptoms. This information is not intended to replace advice given to you by your health care provider. Make sure you discuss any questions you have with your health care provider. Document Released: 10/05/2005 Document Revised: 09/28/2017 Document Reviewed: 09/28/2017 Elsevier Interactive Patient Education  2019 [place] 18-39 Years, Male Preventive care  refers to lifestyle choices and visits with your health care provider that can promote health and wellness. What does preventive care include?   A yearly physical exam. This is also called an annual well check.  Dental exams once or twice a year.  Routine eye exams. Ask your health care provider how often you should have your eyes checked.  Personal lifestyle choices, including: ? Daily care of your teeth and gums. ? Regular physical activity. ? Eating a healthy diet. ? Avoiding tobacco and drug use. ? Limiting alcohol use. ? Practicing safe sex. What happens during an annual well check? The services and screenings done by your health care provider during your annual well check will depend on your age, overall health, lifestyle risk factors, and family history of disease. Counseling Your health care provider may ask you questions about your:  Alcohol use.  Tobacco use.  Drug use.  Emotional well-being.  Home and relationship well-being.  Sexual activity.  Eating habits.  Work and work Statistician. Screening You may have the following tests or measurements:  Height, weight, and BMI.  Blood pressure.  Lipid and cholesterol levels. These may be checked every 5 years  starting at age 26.  Diabetes screening. This is done by checking your blood sugar (glucose) after you have not eaten for a while (fasting).  Skin check.  Hepatitis C blood test.  Hepatitis B blood test.  Sexually transmitted disease (STD) testing. Discuss your test results, treatment options, and if necessary, the need for more tests with your health care provider. Vaccines Your health care provider may recommend certain vaccines, such as:  Influenza vaccine. This is recommended every year.  Tetanus, diphtheria, and acellular pertussis (Tdap, Td) vaccine. You may need a Td booster every 10 years.  Varicella vaccine. You may need this if you have not been vaccinated.  HPV vaccine. If you are  3 or younger, you may need three doses over 6 months.  Measles, mumps, and rubella (MMR) vaccine. You may need at least one dose of MMR.You may also need a second dose.  Pneumococcal 13-valent conjugate (PCV13) vaccine. You may need this if you have certain conditions and have not been vaccinated.  Pneumococcal polysaccharide (PPSV23) vaccine. You may need one or two doses if you smoke cigarettes or if you have certain conditions.  Meningococcal vaccine. One dose is recommended if you are age 59-21 years and a first-year college student living in a residence hall, or if you have one of several medical conditions. You may also need additional booster doses.  Hepatitis A vaccine. You may need this if you have certain conditions or if you travel or work in places where you may be exposed to hepatitis A.  Hepatitis B vaccine. You may need this if you have certain conditions or if you travel or work in places where you may be exposed to hepatitis B.  Haemophilus influenzae type b (Hib) vaccine. You may need this if you have certain risk factors. Talk to your health care provider about which screenings and vaccines you need and how often you need them. This information is not intended to replace advice given to you by your health care provider. Make sure you discuss any questions you have with your health care provider. Document Released: 12/01/2001 Document Revised: 05/18/2017 Document Reviewed: 08/06/2015 Elsevier Interactive Patient Education  2019 [place]  After being diagnosed with an anxiety disorder, you may be relieved to know why you have felt or behaved a certain way. It is natural to also feel overwhelmed about the treatment ahead and what it will mean for your life. With care and support, you can manage this condition and recover from it. How to cope with anxiety Dealing with stress Stress is your body's reaction to life changes and events, both good and  bad. Stress can last just a few hours or it can be ongoing. Stress can play a major role in anxiety, so it is important to learn both how to cope with stress and how to think about it differently. Talk with your health care provider or a counselor to learn more about stress reduction. He or she may suggest some stress reduction techniques, such as:  Music therapy. This can include creating or listening to music that you enjoy and that inspires you.  Mindfulness-based meditation. This involves being aware of your normal breaths, rather than trying to control your breathing. It can be done while sitting or walking.  Centering prayer. This is a kind of meditation that involves focusing on a word, phrase, or sacred image that is meaningful to you and that brings you peace.  Deep breathing. To do this,  expand your stomach and inhale slowly through your nose. Hold your breath for 3-5 seconds. Then exhale slowly, allowing your stomach muscles to relax.  Self-talk. This is a skill where you identify thought patterns that lead to anxiety reactions and correct those thoughts.  Muscle relaxation. This involves tensing muscles then relaxing them. Choose a stress reduction technique that fits your lifestyle and personality. Stress reduction techniques take time and practice. Set aside 5-15 minutes a day to do them. Therapists can offer training in these techniques. The training may be covered by some insurance plans. Other things you can do to manage stress include:  Keeping a stress diary. This can help you learn what triggers your stress and ways to control your response.  Thinking about how you respond to certain situations. You may not be able to control everything, but you can control your reaction.  Making time for activities that help you relax, and not feeling guilty about spending your time in this way. Therapy combined with coping and stress-reduction skills provides the best chance for successful  treatment. Medicines Medicines can help ease symptoms. Medicines for anxiety include:  Anti-anxiety drugs.  Antidepressants.  Beta-blockers. Medicines may be used as the main treatment for anxiety disorder, along with therapy, or if other treatments are not working. Medicines should be prescribed by a health care provider. Relationships Relationships can play a big part in helping you recover. Try to spend more time connecting with trusted friends and family members. Consider going to couples counseling, taking family education classes, or going to family therapy. Therapy can help you and others better understand the condition. How to recognize changes in your condition Everyone has a different response to treatment for anxiety. Recovery from anxiety happens when symptoms decrease and stop interfering with your daily activities at home or work. This may mean that you will start to:  Have better concentration and focus.  Sleep better.  Be less irritable.  Have more energy.  Have improved memory. It is important to recognize when your condition is getting worse. Contact your health care provider if your symptoms interfere with home or work and you do not feel like your condition is improving. Where to find help and support: You can get help and support from these sources:  Self-help groups.  Online and [employer].  A trusted spiritual leader.  Couples counseling.  Family education classes.  Family therapy. Follow these instructions at home:  Eat a healthy diet that includes plenty of vegetables, fruits, whole grains, low-fat dairy products, and lean protein. Do not eat a lot of foods that are high in solid fats, added sugars, or salt.  Exercise. Most adults should do the following: ? Exercise for at least 150 minutes each week. The exercise should increase your heart rate and make you sweat (moderate-intensity exercise). ? Strengthening exercises at least twice  a week.  Cut down on caffeine, tobacco, alcohol, and other potentially harmful substances.  Get the right amount and quality of sleep. Most adults need 7-9 hours of sleep each night.  Make choices that simplify your life.  Take over-the-counter and prescription medicines only as told by your health care provider.  Avoid caffeine, alcohol, and certain over-the-counter cold medicines. These may make you feel worse. Ask your pharmacist which medicines to avoid.  Keep all follow-up visits as told by your health care provider. This is important. Questions to ask your health care provider  Would I benefit from therapy?  How often should I follow  up with a health care provider?  How long do I need to take medicine?  Are there any long-term side effects of my medicine?  Are there any alternatives to taking medicine? Contact a health care provider if:  You have a hard time staying focused or finishing daily tasks.  You spend many hours a day feeling worried about everyday life.  You become exhausted by worry.  You start to have headaches, feel tense, or have nausea.  You urinate more than normal.  You have diarrhea. Get help right away if:  You have a racing heart and shortness of breath.  You have thoughts of hurting yourself or others. If you ever feel like you may hurt yourself or others, or have thoughts about taking your own life, get help right away. You can go to your nearest emergency department or call:  Your local emergency services (911 in the [place]  A suicide crisis helpline, such as the [place] at 6263940768. This is open 24-hours a day. Summary  Taking steps to deal with stress can help calm you.  Medicines cannot cure anxiety disorders, but they can help ease symptoms.  Family, friends, and partners can play a big part in helping you recover from an anxiety disorder. This information is not intended to replace advice given  to you by your health care provider. Make sure you discuss any questions you have with your health care provider. Document Released: 09/29/2016 Document Revised: 09/29/2016 Document Reviewed: 09/29/2016 Elsevier Interactive Patient Education  2019 [employer] Venlafaxine extended-release capsules What is this medicine? VENLAFAXINE(VEN la fax een) is used to treat depression, anxiety and panic disorder. This medicine may be used for other purposes; ask your health care provider or pharmacist if you have questions. COMMON BRAND NAME(S): Effexor XR What should I tell my health care provider before I take this medicine? They need to know if you have any of these conditions: -bleeding disorders -glaucoma -heart disease -high blood pressure -high cholesterol -kidney disease -liver disease -low levels of sodium in the blood -mania or bipolar disorder -seizures -suicidal thoughts, plans, or attempt; a previous suicide attempt by you or a family -take medicines that treat or prevent blood clots -thyroid disease -an unusual or allergic reaction to venlafaxine, desvenlafaxine, other medicines, foods, dyes, or preservatives -pregnant or trying to get pregnant -breast-feeding How should I use this medicine? Take this medicine by mouth with a full glass of water. Follow the directions on the prescription label. Do not cut, crush, or chew this medicine. Take it with food. If needed, the capsule may be carefully opened and the entire contents sprinkled on a spoonful of cool applesauce. Swallow the applesauce/pellet mixture right away without chewing and follow with a glass of water to ensure complete swallowing of the pellets. Try to take your medicine at about the same time each day. Do not take your medicine more often than directed. Do not stop taking this medicine suddenly except upon the advice of your doctor. Stopping this medicine too quickly may cause serious side effects or your condition may  worsen. A special MedGuide will be given to you by the pharmacist with each prescription and refill. Be sure to read this information carefully each time. Talk to your pediatrician regarding the use of this medicine in children. Special care may be needed. Overdosage: If you think you have taken too much of this medicine contact a poison control center or emergency room at once. NOTE: This medicine is  only for you. Do not share this medicine with others. What if I miss a dose? If you miss a dose, take it as soon as you can. If it is almost time for your next dose, take only that dose. Do not take double or extra doses. What may interact with this medicine? Do not take this medicine with any of the following medications: -certain medicines for fungal infections like fluconazole, itraconazole, ketoconazole, posaconazole, voriconazole -cisapride -desvenlafaxine -dofetilide -dronedarone -duloxetine -levomilnacipran -linezolid -MAOIs like Carbex, Eldepryl, Marplan, Nardil, and Parnate -methylene blue (injected into a vein) -milnacipran -pimozide -thioridazine -ziprasidone This medicine may also interact with the following medications: -amphetamines -aspirin and aspirin-like medicines -certain medicines for depression, anxiety, or psychotic disturbances -certain medicines for migraine headaches like almotriptan, eletriptan, frovatriptan, naratriptan, rizatriptan, sumatriptan, zolmitriptan -certain medicines for sleep -certain medicines that treat or prevent blood clots like dalteparin, enoxaparin, warfarin -cimetidine -clozapine -diuretics -fentanyl -furazolidone -indinavir -isoniazid -lithium -metoprolol -NSAIDS, medicines for pain and inflammation, like ibuprofen or naproxen -other medicines that prolong the QT interval (cause an abnormal heart rhythm) -procarbazine -rasagiline -supplements like St. John's wort, kava kava, valerian -tramadol -tryptophan This list may not  describe all possible interactions. Give your health care provider a list of all the medicines, herbs, non-prescription drugs, or dietary supplements you use. Also tell them if you smoke, drink alcohol, or use illegal drugs. Some items may interact with your medicine. What should I watch for while using this medicine? Tell your doctor if your symptoms do not get better or if they get worse. Visit your doctor or health care professional for regular checks on your progress. Because it may take several weeks to see the full effects of this medicine, it is important to continue your treatment as prescribed by your doctor. Patients and their families should watch out for new or worsening thoughts of suicide or depression. Also watch out for sudden changes in feelings such as feeling anxious, agitated, panicky, irritable, hostile, aggressive, impulsive, severely restless, overly excited and hyperactive, or not being able to sleep. If this happens, especially at the beginning of treatment or after a change in dose, call your health care professional. This medicine can cause an increase in blood pressure. Check with your doctor for instructions on monitoring your blood pressure while taking this medicine. You may get drowsy or dizzy. Do not drive, use machinery, or do anything that needs mental alertness until you know how this medicine affects you. Do not stand or sit up quickly, especially if you are an older patient. This reduces the risk of dizzy or fainting spells. Alcohol may interfere with the effect of this medicine. Avoid alcoholic drinks. Your mouth may get dry. Chewing sugarless gum, sucking hard candy and drinking plenty of water will help. Contact your doctor if the problem does not go away or is severe. What side effects may I notice from receiving this medicine? Side effects that you should report to your doctor or health care professional as soon as possible: -allergic reactions like skin rash,  itching or hives, swelling of the face, lips, or tongue -anxious -breathing problems -confusion -changes in vision -chest pain -confusion -elevated mood, decreased need for sleep, racing thoughts, impulsive behavior -eye pain -fast, irregular heartbeat -feeling faint or lightheaded, falls -feeling agitated, angry, or irritable -hallucination, loss of contact with reality -high blood pressure -loss of balance or coordination -palpitations -redness, blistering, peeling or loosening of the skin, including inside the mouth -restlessness, pacing, inability to keep still -seizures -stiff  muscles -suicidal thoughts or other mood changes -trouble passing urine or change in the amount of urine -trouble sleeping -unusual bleeding or bruising -unusually weak or tired -vomiting Side effects that usually do not require medical attention (report to your doctor or health care professional if they continue or are bothersome): -change in sex drive or performance -change in appetite or weight -constipation -dizziness -dry mouth -headache -increased sweating -nausea -tired This list may not describe all possible side effects. Call your doctor for medical advice about side effects. You may report side effects to FDA at 1-800-FDA-1088. Where should I keep my medicine? Keep out of the reach of children. Store at a controlled temperature between 20 and 25 degrees C (68 degrees and 77 degrees F), in a dry place. Throw away any unused medicine after the expiration date. NOTE: This sheet is a summary. It may not cover all possible information. If you have questions about this medicine, talk to your doctor, pharmacist, or health care provider.  2019 Elsevier/[employer] (2016-03-05 18:38:02)

## 2018-10-28 ENCOUNTER — Telehealth: Payer: Self-pay | Admitting: Family Medicine

## 2018-10-28 NOTE — Telephone Encounter (Signed)
Copied from CRM [id number]. Topic: Quick Communication - See Telephone Encounter >> Oct 28, 2018  8:33 AM Herby Abraham C wrote: CRM for notification. See Telephone encounter for: 10/28/18.  Pt would like to have lab orders set up to be collected at the [facility] location instead of [place]. Pt says that he was seen yesterday and advised to come back to complete fasting labs. Pt says that [facility] is more convenient for him.   Please assist.    CB: [phone]

## 2018-10-28 NOTE — Telephone Encounter (Signed)
I spoke with patient, lab appointment made at [facility].

## 2018-11-02 ENCOUNTER — Other Ambulatory Visit (INDEPENDENT_AMBULATORY_CARE_PROVIDER_SITE_OTHER): Payer: BLUE CROSS/BLUE SHIELD

## 2018-11-02 DIAGNOSIS — Z0000 Encounter for general adult medical examination without abnormal findings: Secondary | ICD-10-CM

## 2018-11-02 LAB — COMPREHENSIVE METABOLIC PANEL
ALT: 12 U/L (ref 0–53)
AST: 14 U/L (ref 0–37)
Albumin: 4.7 g/dL (ref 3.5–5.2)
Alkaline Phosphatase: 64 U/L (ref 39–117)
BUN: 15 mg/dL (ref 6–23)
CO2: 27 mEq/L (ref 19–32)
Calcium: 9.9 mg/dL (ref 8.4–10.5)
Chloride: 101 mEq/L (ref 96–112)
Creatinine, Ser: 1.11 mg/dL (ref 0.40–1.50)
GFR: 79.32 mL/min (ref >60.00)
GLUCOSE: 108 mg/dL — AB (ref 70–99)
Potassium: 3.4 mEq/L — ABNORMAL LOW (ref 3.5–5.1)
Sodium: 138 mEq/L (ref 135–145)
Total Bilirubin: 1 mg/dL (ref 0.2–1.2)
Total Protein: 7.5 g/dL (ref 6.0–8.3)

## 2018-11-02 LAB — URINALYSIS, ROUTINE W REFLEX MICROSCOPIC
Bilirubin Urine: NEGATIVE
Hgb urine dipstick: NEGATIVE
Ketones, ur: NEGATIVE
Leukocytes, UA: NEGATIVE
Nitrite: NEGATIVE
RBC / HPF: NONE SEEN (ref 0–?)
Specific Gravity, Urine: 1.01 (ref 1.000–1.030)
Total Protein, Urine: NEGATIVE
Urine Glucose: NEGATIVE
Urobilinogen, UA: 0.2 (ref 0.0–1.0)
WBC, UA: NONE SEEN (ref 0–?)
pH: 7.5 (ref 5.0–8.0)

## 2018-11-02 LAB — CBC
HCT: 43.9 % (ref 39.0–52.0)
Hemoglobin: 15.2 g/dL (ref 13.0–17.0)
MCHC: 34.7 g/dL (ref 30.0–36.0)
MCV: 88.2 fl (ref 78.0–100.0)
Platelets: 259 10*3/uL (ref 150.0–400.0)
RBC: 4.98 Mil/uL (ref 4.22–5.81)
RDW: 12.4 % (ref 11.5–15.5)
WBC: 6.2 10*3/uL (ref 4.0–10.5)

## 2018-11-02 LAB — LIPID PANEL
Cholesterol: 192 mg/dL (ref 0–200)
HDL: 39 mg/dL — ABNORMAL LOW (ref >39.00)
LDL Cholesterol: 135 mg/dL — ABNORMAL HIGH (ref 0–99)
NonHDL: 152.81
Total CHOL/HDL Ratio: 5
Triglycerides: 88 mg/dL (ref 0.0–149.0)
VLDL: 17.6 mg/dL (ref 0.0–40.0)

## 2018-11-04 DIAGNOSIS — H2000 Unspecified acute and subacute iridocyclitis: Secondary | ICD-10-CM | POA: Diagnosis not present

## 2018-11-13 ENCOUNTER — Ambulatory Visit (HOSPITAL_COMMUNITY)
Admission: EM | Admit: 2018-11-13 | Discharge: 2018-11-13 | Disposition: A | Discharge status: Home / Self Care | Payer: BLUE CROSS/BLUE SHIELD | Attending: Physician Assistant | Admitting: Physician Assistant

## 2018-11-13 ENCOUNTER — Other Ambulatory Visit: Payer: Self-pay

## 2018-11-13 ENCOUNTER — Encounter (HOSPITAL_COMMUNITY): Payer: Self-pay | Admitting: Emergency Medicine

## 2018-11-13 DIAGNOSIS — R258 Other abnormal involuntary movements: Secondary | ICD-10-CM

## 2018-11-13 DIAGNOSIS — R292 Abnormal reflex: Secondary | ICD-10-CM | POA: Diagnosis not present

## 2018-11-13 HISTORY — DX: Anxiety disorder, unspecified: F41.9

## 2018-11-13 HISTORY — DX: Depression, unspecified: F32.A

## 2018-11-13 HISTORY — DX: Major depressive disorder, single episode, unspecified: F32.9

## 2018-11-13 MED ORDER — FLUOXETINE HCL 20 MG PO TABS: 20.0000 mg | ORAL_TABLET | Freq: Every day | ORAL | 0 refills | Status: DC

## 2018-11-13 NOTE — ED Triage Notes (Signed)
The patient presented to the [facility] with a complaint of a possible reaction to a medication. The patient stated that he is taking Venlafaxine 37.5 mg and has been having some "twitching."

## 2018-11-13 NOTE — Discharge Instructions (Addendum)
Take one tab of venlafaxine 37.5 for 1 week.  Take fluoxetine 20 mg daily.

## 2018-11-13 NOTE — ED Provider Notes (Signed)
11/13/2018 6:04 PM   DOB: [birth year] / MRN: [id number]  SUBJECTIVE:  Stephen Cruz is a 38 y.o. male presenting for twitching about the legs and chest started after taking venlafaxine.  Reports the symptoms worsen as his dose increased.  He denies diarrhea, diaphoresis, chest pain, shortness of breath, paresthesia.   He has No Known Allergies.   He  has a past medical history of Anxiety and Depression.    He  reports that he has never smoked. He has never used smokeless tobacco. He reports previous alcohol use. He reports that he does not use drugs. He  reports never being sexually active. The patient  has a past surgical history that includes Abdominal surgery.  His family history is not on file.  Review of Systems  Constitutional: Negative for chills, diaphoresis, fever, malaise/fatigue and weight loss.  Skin: Negative for itching and rash.    OBJECTIVE:  BP 136/80 (BP Location: Right Arm)   Pulse 80   Temp 99.7 F (37.6 C) (Oral)   Resp 18   SpO2 100%   Wt Readings from Last 3 Encounters:  10/27/18 167 lb 4 oz (75.9 kg)  09/23/18 167 lb 12.8 oz (76.1 kg)  09/03/18 175 lb (79.4 kg)   Temp Readings from Last 3 Encounters:  11/13/18 99.7 F (37.6 C) (Oral)  10/27/18 (!) 97.5 F (36.4 C) (Oral)  09/21/18 97.9 F (36.6 C) (Oral)   BP Readings from Last 3 Encounters:  11/13/18 136/80  10/27/18 126/70  09/23/18 (!) 128/56   Pulse Readings from Last 3 Encounters:  11/13/18 80  10/27/18 62  09/23/18 66    Physical Exam Vitals signs and nursing note reviewed.  Constitutional:      Appearance: He is well-developed. He is not ill-appearing or diaphoretic.  Eyes:     Conjunctiva/sclera: Conjunctivae normal.     Pupils: Pupils are equal, round, and reactive to light.  Cardiovascular:     Rate and Rhythm: Normal rate and regular rhythm.  Pulmonary:     Effort: Pulmonary effort is normal.  Abdominal:     General: There is no distension.  Musculoskeletal: Normal  range of motion.  Skin:    General: Skin is warm and dry.  Neurological:     Mental Status: He is alert and oriented to person, place, and time.     Cranial Nerves: No cranial nerve deficit.     Coordination: Coordination normal.     Comments: Positive Hoffmann sign on the left upper extremity.  3 beats beats of clonus in the lower extremities.     No results found for this or any previous visit (from the past 72 hour(s)).  No results found.  ASSESSMENT AND PLAN:   Hyperreflexia - Patient with a positive Hoffmann sign on the left.  He has 3 beats of clonus bilaterally.  Have spoken with a psychiatrist and she feels that he is likely a poor metabolizer of venlafaxine additionally pantoprazole shares some of the receptors for that drug thus making him an even poorer metabolizer.  She suggest he go down to 37.5 and start fluoxetine 20 mg to reduce side effects of cessation.    Discharge Instructions     Take one tab of venlafaxine 37.5 for 1 week.  Take fluoxetine 20 mg daily.         The patient is advised to call or return to clinic if he does not see an improvement in symptoms, or to seek the care of the closest emergency  department if he worsens with the above plan.   Deliah BostonMichael Katira Dumais, MHS, PA-C 11/13/2018 6:04 PM   Ofilia Neaslark, Makinsley Schiavi L, PA-C 11/13/18 1805

## 2018-11-16 ENCOUNTER — Other Ambulatory Visit: Payer: Self-pay | Admitting: Gastroenterology

## 2018-11-16 ENCOUNTER — Telehealth: Payer: Self-pay

## 2018-11-16 DIAGNOSIS — R109 Unspecified abdominal pain: Secondary | ICD-10-CM

## 2018-11-16 DIAGNOSIS — Z8719 Personal history of other diseases of the digestive system: Secondary | ICD-10-CM | POA: Diagnosis not present

## 2018-11-16 DIAGNOSIS — R110 Nausea: Secondary | ICD-10-CM | POA: Diagnosis not present

## 2018-11-16 DIAGNOSIS — K219 Gastro-esophageal reflux disease without esophagitis: Secondary | ICD-10-CM | POA: Diagnosis not present

## 2018-11-16 DIAGNOSIS — R140 Abdominal distension (gaseous): Secondary | ICD-10-CM | POA: Diagnosis not present

## 2018-11-16 NOTE — Telephone Encounter (Signed)
FYI patient is scheduled to come in on 1/31.  Copied from CRM [id number]. Topic: General - Other >> Nov 16, 2018 10:59 AM Percival Spanish wrote:  Pt call to say the medicine gave him trembles and he went to urgent care and they took him off I made him an appt to come and discuss with the doctor and told him to bring the paperwork form urgent care  if Dr Doreene Burke does not need to see the pt please call and cancer the appt   venlafaxine XR (EFFEXOR-XR) 37.5 MG 24 hr capsule

## 2018-11-17 ENCOUNTER — Other Ambulatory Visit: Payer: Self-pay | Admitting: Gastroenterology

## 2018-11-17 DIAGNOSIS — R110 Nausea: Secondary | ICD-10-CM

## 2018-11-17 DIAGNOSIS — R109 Unspecified abdominal pain: Secondary | ICD-10-CM

## 2018-11-18 ENCOUNTER — Ambulatory Visit: Payer: BLUE CROSS/BLUE SHIELD | Admitting: Family Medicine

## 2018-11-18 ENCOUNTER — Encounter: Payer: Self-pay | Admitting: Family Medicine

## 2018-11-18 VITALS — BP 120/70 | HR 85 | Ht 70.0 in | Wt 163.0 lb

## 2018-11-18 DIAGNOSIS — F418 Other specified anxiety disorders: Secondary | ICD-10-CM | POA: Diagnosis not present

## 2018-11-18 MED ORDER — PAROXETINE HCL ER 12.5 MG PO TB24: 12.5000 mg | ORAL_TABLET | Freq: Every day | ORAL | 1 refills | Status: DC

## 2018-11-18 NOTE — Progress Notes (Signed)
Established Patient Office Visit  Subjective:  Patient ID: Stephen Cruz, male    DOB: 09/21/1981  Age: 38 y.o. MRN: [id number]  CC:  Chief Complaint  Patient presents with  . Follow-up    HPI Stephen Cruz presents for follow-up status post significant side effects with the Effexor.  Patient had noticed fasciculations that became worse after he started the higher dose of Effexor.  He was seen at urgent care and was found to have hyperreflexia.  The Effexor was discontinued with a week taper patient was started on Prozac.  Patient is only taken 2 doses of Prozac.  Patient was recently started on Cosamin as needed for abdominal cramping.  Past Medical History:  Diagnosis Date  . Anxiety   . Depression     Past Surgical History:  Procedure Laterality Date  . ABDOMINAL SURGERY      History reviewed. No pertinent family history.  Social History   Socioeconomic History  . Marital status: Single    Spouse name: Not on file  . Number of children: Not on file  . Years of education: Not on file  . Highest education level: Not on file  Occupational History  . Not on file  Social Needs  . Financial resource strain: Not on file  . Food insecurity:    Worry: Not on file    Inability: Not on file  . Transportation needs:    Medical: Not on file    Non-medical: Not on file  Tobacco Use  . Smoking status: Never Smoker  . Smokeless tobacco: Never Used  Substance and Sexual Activity  . Alcohol use: Not Currently    Frequency: Never  . Drug use: Never  . Sexual activity: Never  Lifestyle  . Physical activity:    Days per week: Not on file    Minutes per session: Not on file  . Stress: Not on file  Relationships  . Social connections:    Talks on phone: Not on file    Gets together: Not on file    Attends religious service: Not on file    Active member of club or organization: Not on file    Attends meetings of clubs or organizations: Not on file    Relationship  status: Not on file  . Intimate partner violence:    Fear of current or ex partner: Not on file    Emotionally abused: Not on file    Physically abused: Not on file    Forced sexual activity: Not on file  Other Topics Concern  . Not on file  Social History Narrative  . Not on file    Outpatient Medications Prior to Visit  Medication Sig Dispense Refill  . hyoscyamine (ANASPAZ) 0.125 MG TBDP disintergrating tablet Place 0.125 mg under the tongue 3 (three) times daily.    . pantoprazole (PROTONIX) 40 MG tablet Take 40 mg by mouth daily.    . prednisoLONE acetate (PRED FORTE) 1 % ophthalmic suspension 1 drop. Into affected eye.    Marland Kitchen. FLUoxetine (PROZAC) 20 MG tablet Take 1 tablet (20 mg total) by mouth daily. 14 tablet 0  . venlafaxine XR (EFFEXOR-XR) 37.5 MG 24 hr capsule Take 1 capsule (37.5 mg total) by mouth daily with breakfast. For one week and then increase to 2 each morning with breakfast. 60 capsule 0   No facility-administered medications prior to visit.     No Known Allergies  ROS Review of Systems  Constitutional: Negative.   Respiratory: Negative.  Cardiovascular: Negative.   Gastrointestinal: Negative.   Neurological: Negative for tremors, seizures and headaches.  Psychiatric/Behavioral: Positive for dysphoric mood. Negative for self-injury and suicidal ideas. The patient is nervous/anxious.       Objective:    Physical Exam  Constitutional: He is oriented to person, place, and time. He appears well-developed and well-nourished. No distress.  HENT:  Head: Normocephalic and atraumatic.  Right Ear: External ear normal.  Left Ear: External ear normal.  Mouth/Throat: Oropharynx is clear and moist. No oropharyngeal exudate.  Eyes: Pupils are equal, round, and reactive to light. Conjunctivae are normal. Right eye exhibits no discharge. Left eye exhibits no discharge. No scleral icterus.  Neck: Neck supple. No JVD present. No tracheal deviation present. No  thyromegaly present.  Cardiovascular: Normal rate, regular rhythm and normal heart sounds.  Pulmonary/Chest: Effort normal and breath sounds normal. No stridor.  Lymphadenopathy:    He has no cervical adenopathy.  Neurological: He is alert and oriented to person, place, and time.  Reflex Scores:      Patellar reflexes are 2+ on the right side and 2+ on the left side. Skin: Skin is warm and dry. He is not diaphoretic.  Psychiatric: He has a normal mood and affect. His behavior is normal.    BP 120/70   Pulse 85   Ht 5\' 10"  (1.778 m)   Wt 163 lb (73.9 kg)   SpO2 99%   BMI 23.39 kg/m  Wt Readings from Last 3 Encounters:  11/18/18 163 lb (73.9 kg)  10/27/18 167 lb 4 oz (75.9 kg)  09/23/18 167 lb 12.8 oz (76.1 kg)   BP Readings from Last 3 Encounters:  11/18/18 120/70  11/13/18 136/80  10/27/18 126/70   Guideline developer:  UpToDate (see UpToDate for funding source) Date Released: June 2014  Health Maintenance Due  Topic Date Due  . HIV Screening  01/18/1997  . INFLUENZA VACCINE  05/19/2018    There are no preventive care reminders to display for this patient.  Lab Results  Component Value Date   TSH 0.856 09/23/2018   Lab Results  Component Value Date   WBC 6.2 11/02/2018   HGB 15.2 11/02/2018   HCT 43.9 11/02/2018   MCV 88.2 11/02/2018   PLT 259.0 11/02/2018   Lab Results  Component Value Date   NA 138 11/02/2018   K 3.4 (L) 11/02/2018   CO2 27 11/02/2018   GLUCOSE 108 (H) 11/02/2018   BUN 15 11/02/2018   CREATININE 1.11 11/02/2018   BILITOT 1.0 11/02/2018   ALKPHOS 64 11/02/2018   AST 14 11/02/2018   ALT 12 11/02/2018   PROT 7.5 11/02/2018   ALBUMIN 4.7 11/02/2018   CALCIUM 9.9 11/02/2018   ANIONGAP 6 09/21/2018   GFR 79.32 11/02/2018   Lab Results  Component Value Date   CHOL 192 11/02/2018   Lab Results  Component Value Date   HDL 39.00 (L) 11/02/2018   Lab Results  Component Value Date   LDLCALC 135 (H) 11/02/2018   Lab Results    Component Value Date   TRIG 88.0 11/02/2018   Lab Results  Component Value Date   CHOLHDL 5 11/02/2018   No results found for: HGBA1C    Assessment & Plan:   Problem List Items Addressed This Visit      Other   Depression with anxiety - Primary   Relevant Medications   PARoxetine (PAXIL-CR) 12.5 MG 24 hr tablet      Meds ordered this encounter  Medications  . PARoxetine (PAXIL-CR) 12.5 MG 24 hr tablet    Sig: Take 1 tablet (12.5 mg total) by mouth daily.    Dispense:  30 tablet    Refill:  1    Follow-up: Return in about 1 month (around 12/17/2018), or if symptoms worsen or fail to improve.   Patient will discontinue Prozac and Effexor.  Believe that Paxil is a better choice for him.  There is a documented 4 pound weight loss.  Prozac may lead to further weight loss.  We will start Paxil 12.5 mg CR.  Patient will adjust the time of day that he takes it.  He will start out taking it in the evening.  Follow-up will be or sooner if needed.  Information was given Tylenol Paxil.

## 2018-11-18 NOTE — Patient Instructions (Signed)
Paroxetine Controlled-Release Tablets  What is this medicine?  PAROXETINE (pa ROX e teen) is used to treat depression. It may also be used to treat anxiety disorders, obsessive compulsive disorder, panic attacks, post traumatic stress, and premenstrual dysphoric disorder (PMDD).  This medicine may be used for other purposes; ask your health care provider or pharmacist if you have questions.  COMMON BRAND NAME(S): Paxil CR  What should I tell my health care provider before I take this medicine?  They need to know if you have any of these conditions:  -bipolar disorder or a family history of bipolar disorder  -bleeding disorders  -glaucoma  -heart disease  -kidney disease  -liver disease  -low levels of sodium in the blood  -seizures  -suicidal thoughts, plans, or attempt; a previous suicide attempt by you or a family member  -take MAOIs like Carbex, Eldepryl, Marplan, Nardil, and Parnate  -take medicines that treat or prevent blood clots  -thyroid disease  -an unusual or allergic reaction to paroxetine, other medicines, foods, dyes, or preservatives  -pregnant or trying to get pregnant  -breast-feeding  How should I use this medicine?  Take this medicine by mouth with a glass of water. Follow the directions on the prescription label. You can take it with or without food. Do not crush or chew this medicine. Take your medicine at regular intervals. Do not take your medicine more often than directed. Do not stop taking this medicine suddenly except upon the advice of your doctor. Stopping this medicine too quickly may cause serious side effects or your condition may worsen.  A special MedGuide will be given to you by the pharmacist with each prescription and refill. Be sure to read this information carefully each time.  Talk to your pediatrician regarding the use of this medicine in children. Special care may be needed.  Overdosage: If you think you have taken too much of this medicine contact a poison control center or  emergency room at once.  NOTE: This medicine is only for you. Do not share this medicine with others.  What if I miss a dose?  If you miss a dose, take it as soon as you can. If it is almost time for your next dose, take only that dose. Do not take double or extra doses.  What may interact with this medicine?  Do not take this medicine with any of the following medications:  -linezolid  -MAOIs like Carbex, Eldepryl, Marplan, Nardil, and Parnate  -methylene blue (injected into a vein)  -pimozide  -thioridazine  This medicine may also interact with the following medications:  -alcohol  -amphetamines  -aspirin and aspirin-like medicines  -atomoxetine  -certain medicines for depression, anxiety, or psychotic disturbances  -certain medicines for irregular heart beat like propafenone, flecainide, encainide, and quinidine  -certain medicines for migraine headache like almotriptan, eletriptan, frovatriptan, naratriptan, rizatriptan, sumatriptan, zolmitriptan  -cimetidine  -digoxin  -diuretics  -fentanyl  -fosamprenavir  -furazolidone  -isoniazid  -lithium  -medicines that treat or prevent blood clots like warfarin, enoxaparin, and dalteparin  -medicines for sleep  -NSAIDs, medicines for pain and inflammation, like ibuprofen or naproxen  -phenobarbital  -phenytoin  -procarbazine  -rasagiline  -ritonavir  -supplements like St. John's wort, kava kava, valerian  -tamoxifen  -tramadol  -tryptophan  This list may not describe all possible interactions. Give your health care provider a list of all the medicines, herbs, non-prescription drugs, or dietary supplements you use. Also tell them if you smoke, drink alcohol, or use   illegal drugs. Some items may interact with your medicine.  What should I watch for while using this medicine?  Tell your doctor if your symptoms do not get better or if they get worse. Visit your doctor or health care professional for regular checks on your progress. Because it may take several weeks to see  the full effects of this medicine, it is important to continue your treatment as prescribed by your doctor.  Patients and their families should watch out for new or worsening thoughts of suicide or depression. Also watch out for sudden changes in feelings such as feeling anxious, agitated, panicky, irritable, hostile, aggressive, impulsive, severely restless, overly excited and hyperactive, or not being able to sleep. If this happens, especially at the beginning of treatment or after a change in dose, call your health care professional.  You may get drowsy or dizzy. Do not drive, use machinery, or do anything that needs mental alertness until you know how this medicine affects you. Do not stand or sit up quickly, especially if you are an older patient. This reduces the risk of dizzy or fainting spells. Alcohol may interfere with the effect of this medicine. Avoid alcoholic drinks.  Your mouth may get dry. Chewing sugarless gum or sucking hard candy, and drinking plenty of water will help. Contact your doctor if the problem does not go away or is severe.  What side effects may I notice from receiving this medicine?  Side effects that you should report to your doctor or health care professional as soon as possible:  -allergic reactions like skin rash, itching or hives, swelling of the face, lips, or tongue  -anxious  -black, tarry stools  -changes in vision  -confusion  -elevated mood, decreased need for sleep, racing thoughts, impulsive behavior  -eye pain  -fast, irregular heartbeat  -feeling faint or lightheaded, falls  -feeling agitated, angry, or irritable  -hallucination, loss of contact with reality  -loss of balance or coordination  -loss of memory  -painful or prolonged erections  -restlessness, pacing, inability to keep still  -seizures  -stiff muscles  -suicidal thoughts or other mood changes  -trouble sleeping  -unusual bleeding or bruising  -unusually weak or tired  -vomiting  Side effects that usually do  not require medical attention (report to your doctor or health care professional if they continue or are bothersome):  -change in appetite or weight  -change in sex drive or performance  -diarrhea  -dizziness  -dry mouth  -headache  -increased sweating  -indigestion, nausea  -tired  -tremors  This list may not describe all possible side effects. Call your doctor for medical advice about side effects. You may report side effects to FDA at 1-800-FDA-1088.  Where should I keep my medicine?  Keep out of the reach of children.  Store at or below 25 degrees C (77 degrees F). Throw away any unused medicine after the expiration date.  NOTE: This sheet is a summary. It may not cover all possible information. If you have questions about this medicine, talk to your doctor, pharmacist, or health care provider.  © 2019 [employer] (2016-03-07 15:48:13)

## 2018-11-20 ENCOUNTER — Other Ambulatory Visit: Payer: Self-pay | Admitting: Family Medicine

## 2018-11-20 DIAGNOSIS — F418 Other specified anxiety disorders: Secondary | ICD-10-CM

## 2018-11-21 ENCOUNTER — Other Ambulatory Visit: Payer: BLUE CROSS/BLUE SHIELD

## 2018-11-23 ENCOUNTER — Telehealth: Payer: Self-pay | Admitting: Family Medicine

## 2018-11-23 DIAGNOSIS — F418 Other specified anxiety disorders: Secondary | ICD-10-CM

## 2018-11-23 NOTE — Telephone Encounter (Signed)
Copied from CRM [id number]. Topic: Quick Communication - See Telephone Encounter >> Nov 23, 2018  3:05 PM Arlyss Gandy, NT wrote: CRM for notification. See Telephone encounter for: 11/23/18. Pt states that his insurance will not cover the PARoxetine (PAXIL-CR) 12.5 MG 24 hr tablet and the pharmacy suggested him to contact his PCP to have the med changed to something else. Please advise.

## 2018-11-24 ENCOUNTER — Ambulatory Visit
Admission: RE | Admit: 2018-11-24 | Discharge: 2018-11-24 | Disposition: A | Discharge status: Home / Self Care | Payer: BLUE CROSS/BLUE SHIELD | Source: Ambulatory Visit | Attending: Gastroenterology | Admitting: Gastroenterology

## 2018-11-24 DIAGNOSIS — R109 Unspecified abdominal pain: Secondary | ICD-10-CM

## 2018-11-24 DIAGNOSIS — R110 Nausea: Secondary | ICD-10-CM | POA: Diagnosis not present

## 2018-11-24 MED ORDER — PAROXETINE HCL 10 MG PO TABS: 10.0000 mg | ORAL_TABLET | Freq: Every day | ORAL | 1 refills | Status: DC

## 2018-11-24 NOTE — Telephone Encounter (Signed)
I left patient a voicemail letting him know that a new Rx was sent in and should be more affordable for him. Advised him to call back with any questions.

## 2018-11-27 ENCOUNTER — Encounter (HOSPITAL_COMMUNITY): Payer: Self-pay

## 2018-11-27 ENCOUNTER — Ambulatory Visit (HOSPITAL_COMMUNITY)
Admission: EM | Admit: 2018-11-27 | Discharge: 2018-11-27 | Disposition: A | Discharge status: Home / Self Care | Payer: BLUE CROSS/BLUE SHIELD | Attending: Internal Medicine | Admitting: Internal Medicine

## 2018-11-27 DIAGNOSIS — R292 Abnormal reflex: Secondary | ICD-10-CM | POA: Diagnosis not present

## 2018-11-27 NOTE — ED Triage Notes (Signed)
Pt presents with what he believes to be a reaction to a medication he just got switched to; pt said he is having involuntary muscle movement, nausea, and diarrhea.

## 2018-11-27 NOTE — ED Provider Notes (Signed)
[facility]    CSN: [id number] Arrival date & time: 11/27/18  1727     History   Chief Complaint Chief Complaint  Patient presents with  . Reaction to Medication    HPI Stephen Cruz is a 38 y.o. male with a history of depression managed previously with venlafaxine changed to paroxetine comes to the urgent care department with complaints of involuntary muscle twitching, nausea and diarrhea after starting paroxetine about 24 hours ago.  Patient took a dose of paroxetine 24 hours ago.  He was recently changed from venlafaxine on account of a possible mild serotonin syndrome.  Patient symptoms subsided 5 days after the last dose of venlafaxine was taking.  He has so far taken 1 dose of paroxetine with the complaints as stated above.  No aggravating or relieving factors.  Patient denies any confusion. HPI  Past Medical History:  Diagnosis Date  . Anxiety   . Depression     Patient Active Problem List   Diagnosis Date Noted  . Depression with anxiety 10/27/2018  . Irritable bowel syndrome 10/27/2018  . Health care maintenance 10/27/2018  . Palpitations 09/23/2018  . Chest discomfort 09/23/2018    Past Surgical History:  Procedure Laterality Date  . ABDOMINAL SURGERY         Home Medications    Prior to Admission medications   Medication Sig Start Date End Date Taking? Authorizing Provider  hyoscyamine (ANASPAZ) 0.125 MG TBDP disintergrating tablet Place 0.125 mg under the tongue 3 (three) times daily.    [provider]  pantoprazole (PROTONIX) 40 MG tablet Take 40 mg by mouth daily.    [provider]  prednisoLONE acetate (PRED FORTE) 1 % ophthalmic suspension 1 drop. Into affected eye.    [provider]    Family History History reviewed. No pertinent family history.  Social History Social History   Tobacco Use  . Smoking status: Never Smoker  . Smokeless tobacco: Never Used  Substance Use Topics  . Alcohol use: Not  Currently    Frequency: Never  . Drug use: Never     Allergies   Patient has no known allergies.   Review of Systems Review of Systems  Constitutional: Negative for activity change, appetite change, diaphoresis and fever.  Eyes: Negative for photophobia, pain, redness, itching and visual disturbance.  Respiratory: Negative for cough, chest tightness, shortness of breath and wheezing.   Cardiovascular: Negative for chest pain and palpitations.  Gastrointestinal: Positive for diarrhea. Negative for abdominal distention, abdominal pain, blood in stool, nausea and vomiting.  Musculoskeletal: Negative for arthralgias, back pain, gait problem and myalgias.  Skin: Negative for color change, pallor and rash.  Allergic/Immunologic: Negative for environmental allergies and food allergies.  Neurological: Negative for dizziness, weakness, light-headedness, numbness and headaches.  Hematological: Negative for adenopathy.  Psychiatric/Behavioral: Negative for confusion, decreased concentration, sleep disturbance and suicidal ideas. The patient is not hyperactive.      Physical Exam Triage Vital Signs ED Triage Vitals  Enc Vitals Group     BP 11/27/18 1815 (!) 157/96     Pulse Rate 11/27/18 1815 97     Resp 11/27/18 1815 18     Temp 11/27/18 1815 98 F (36.7 C)     Temp Source 11/27/18 1815 Oral     SpO2 11/27/18 1815 100 %     Weight --      Height --      Head Circumference --      Peak Flow --  Pain Score 11/27/18 1818 4     Pain Loc --      Pain Edu? --      Excl. in GC? --    No data found.  Updated Vital Signs BP (!) 157/96 (BP Location: Right Arm)   Pulse 97   Temp 98 F (36.7 C) (Oral)   Resp 18   SpO2 100%   Visual Acuity Right Eye Distance:   Left Eye Distance:   Bilateral Distance:    Right Eye Near:   Left Eye Near:    Bilateral Near:     Physical Exam Constitutional:      Appearance: He is not ill-appearing or diaphoretic.  HENT:     Nose: Nose  normal. No congestion or rhinorrhea.     Mouth/Throat:     Pharynx: No oropharyngeal exudate or posterior oropharyngeal erythema.  Eyes:     Conjunctiva/sclera: Conjunctivae normal.  Neck:     Musculoskeletal: No neck rigidity or muscular tenderness.  Cardiovascular:     Rate and Rhythm: Normal rate and regular rhythm.     Heart sounds: No murmur. No gallop.   Abdominal:     General: There is no distension.     Tenderness: There is no abdominal tenderness. There is no guarding or rebound.  Musculoskeletal: Normal range of motion.  Skin:    Capillary Refill: Capillary refill takes less than 2 seconds.     Coloration: Skin is not jaundiced.     Findings: No bruising, erythema, lesion or rash.  Neurological:     General: No focal deficit present.     Mental Status: He is alert.     Cranial Nerves: No cranial nerve deficit.     Sensory: No sensory deficit.     Motor: No weakness.     Coordination: Coordination normal.     Gait: Gait normal.     Deep Tendon Reflexes: Reflexes abnormal.  Psychiatric:        Mood and Affect: Mood normal.        Behavior: Behavior normal.        Thought Content: Thought content normal.        Judgment: Judgment normal.      UC Treatments / Results  Labs (all labs ordered are listed, but only abnormal results are displayed) Labs Reviewed - No data to display  EKG None  Radiology No results found.  Procedures Procedures (including critical care time)  Medications Ordered in UC Medications - No data to display  Initial Impression / Assessment and Plan / UC Course  I have reviewed the triage vital signs and the nursing notes.  Pertinent labs & imaging results that were available during my care of the patient were reviewed by me and considered in my medical decision making (see chart for details).    1.  Generalized hyperreflexia (possibly mild serotonin syndrome): Patient is not tachycardic, confused or flushed hence the likelihood of  significant serotonin syndrome is low Patient symptoms may be secondary to GI side effects of paroxetine Patient is advised to discontinue taking paroxetine and follow-up with primary care physician He may need a psychiatry evaluation for antidepressant medication selection  2.  Depression currently on paroxetine: Discontinue paroxetine Patient did not tolerate venlafaxine in the past  Final Clinical Impressions(s) / UC Diagnoses   Final diagnoses:  Generalized hyperreflexia   Discharge Instructions   None    ED Prescriptions    None     Controlled Substance Prescriptions [place]  Controlled Substance Registry consulted? No   Merrilee JanskyLamptey, Philip O, MD 11/28/18 [phone]

## 2018-11-30 ENCOUNTER — Telehealth: Payer: Self-pay

## 2018-11-30 DIAGNOSIS — F418 Other specified anxiety disorders: Secondary | ICD-10-CM

## 2018-11-30 NOTE — Telephone Encounter (Signed)
Copied from CRM [id number]. Topic: General - Other >> Nov 30, 2018  3:06 PM Percival Spanish wrote:  Pt cal lto say that Paxil is also causing him to have twitching and muscles issues

## 2018-12-01 ENCOUNTER — Ambulatory Visit: Payer: BLUE CROSS/BLUE SHIELD | Admitting: Family Medicine

## 2018-12-02 MED ORDER — MIRTAZAPINE 7.5 MG PO TABS: ORAL_TABLET | ORAL | 0 refills | Status: DC

## 2018-12-02 NOTE — Telephone Encounter (Signed)
This is the 2nd time Pt has called.... He stopped taking the Paxel because of the reaction on the 12th, he has not heard back in regards of what to do instead.  If possible call him before the end of the day at [phone]

## 2018-12-02 NOTE — Addendum Note (Signed)
Addended by: Nadene Rubins A on: 12/02/2018 04:00 PM   Modules accepted: Orders

## 2018-12-02 NOTE — Telephone Encounter (Signed)
I called and spoke with patient. He is aware of the message below & will keep his appointment for 2/28. & will update [place] on any side effects he experiences.

## 2018-12-16 ENCOUNTER — Ambulatory Visit: Payer: BLUE CROSS/BLUE SHIELD | Admitting: Family Medicine

## 2018-12-16 ENCOUNTER — Encounter: Payer: Self-pay | Admitting: Family Medicine

## 2018-12-16 VITALS — BP 112/72 | HR 70 | Temp 98.9°F | Ht 70.0 in | Wt 165.0 lb

## 2018-12-16 DIAGNOSIS — F418 Other specified anxiety disorders: Secondary | ICD-10-CM | POA: Diagnosis not present

## 2018-12-16 DIAGNOSIS — T887XXA Unspecified adverse effect of drug or medicament, initial encounter: Secondary | ICD-10-CM

## 2018-12-16 MED ORDER — BUPROPION HCL 75 MG PO TABS: ORAL_TABLET | ORAL | 1 refills | Status: AC

## 2018-12-16 NOTE — Progress Notes (Signed)
Established Patient Office Visit  Subjective:  Patient ID: Stephen Cruz, male    DOB: Apr 23, 1981  Age: 38 y.o. MRN: [id number]  CC:  Chief Complaint  Patient presents with  . Follow-up    Patient states that he took the Mirtazapine for 1 week and it caused muscle twitching so he had to stop taking it.     HPI Stephen Cruz presents for follow-up of his depression.  Patient did not tolerate the mirtazapine.  It also seemed to cause muscle twitching.  He had to discontinue it.  Paroxetine and venlafaxine also have caused twitching.  He has taken Zoloft in the past without side effect when he was in college but felt as though he did not do much to help his depression.  Patient is planning on moving back to [place] at the end of March.  Patient has been taking Protonix for the last 3 months and is concerned about magnesium deficiency.  He has tried to discontinue it but developed reflux shortly after doing so.  Past Medical History:  Diagnosis Date  . Anxiety   . Depression     Past Surgical History:  Procedure Laterality Date  . ABDOMINAL SURGERY      No family history on file.  Social History   Socioeconomic History  . Marital status: Single    Spouse name: Not on file  . Number of children: Not on file  . Years of education: Not on file  . Highest education level: Not on file  Occupational History  . Not on file  Social Needs  . Financial resource strain: Not on file  . Food insecurity:    Worry: Not on file    Inability: Not on file  . Transportation needs:    Medical: Not on file    Non-medical: Not on file  Tobacco Use  . Smoking status: Never Smoker  . Smokeless tobacco: Never Used  Substance and Sexual Activity  . Alcohol use: Not Currently    Frequency: Never  . Drug use: Never  . Sexual activity: Never  Lifestyle  . Physical activity:    Days per week: Not on file    Minutes per session: Not on file  . Stress: Not on file  Relationships  .  Social connections:    Talks on phone: Not on file    Gets together: Not on file    Attends religious service: Not on file    Active member of club or organization: Not on file    Attends meetings of clubs or organizations: Not on file    Relationship status: Not on file  . Intimate partner violence:    Fear of current or ex partner: Not on file    Emotionally abused: Not on file    Physically abused: Not on file    Forced sexual activity: Not on file  Other Topics Concern  . Not on file  Social History Narrative  . Not on file    Outpatient Medications Prior to Visit  Medication Sig Dispense Refill  . hyoscyamine (ANASPAZ) 0.125 MG TBDP disintergrating tablet Place 0.125 mg under the tongue 3 (three) times daily.    . pantoprazole (PROTONIX) 40 MG tablet Take 40 mg by mouth daily.    . mirtazapine (REMERON) 7.5 MG tablet Take one nightly for one week and then increase to 2 nightly before bed. 60 tablet 0  . prednisoLONE acetate (PRED FORTE) 1 % ophthalmic suspension 1 drop. Into affected eye.  No facility-administered medications prior to visit.     Allergies  Allergen Reactions  . Mirtazapine Other (See Comments)    Muscle twitching    ROS Review of Systems  Constitutional: Negative.   Respiratory: Negative.   Cardiovascular: Negative.   Gastrointestinal: Negative.   Psychiatric/Behavioral: Positive for dysphoric mood.      Objective:    Physical Exam  Constitutional: He is oriented to person, place, and time. He appears well-developed and well-nourished. No distress.  HENT:  Head: Normocephalic and atraumatic.  Right Ear: External ear normal.  Left Ear: External ear normal.  Eyes: Right eye exhibits no discharge. Left eye exhibits no discharge. No scleral icterus.  Neck: No JVD present. No tracheal deviation present.  Pulmonary/Chest: Effort normal. No stridor.  Neurological: He is alert and oriented to person, place, and time.  Skin: Skin is warm and  dry. He is not diaphoretic.  Psychiatric: He has a normal mood and affect. His behavior is normal.    BP 112/72 (BP Location: Right Arm, Patient Position: Sitting, Cuff Size: Normal)   Pulse 70   Temp 98.9 F (37.2 C) (Oral)   Ht  (1.778 m)   Wt 165 lb (74.8 kg)   SpO2 98%   BMI 23.68 kg/m  Wt Readings from Last 3 Encounters:  12/16/18 165 lb (74.8 kg)  11/18/18 163 lb (73.9 kg)  10/27/18 167 lb 4 oz (75.9 kg)     Health Maintenance Due  Topic Date Due  . HIV Screening  01/18/1997  . INFLUENZA VACCINE  05/19/2018    There are no preventive care reminders to display for this patient.  Lab Results  Component Value Date   TSH 0.856 09/23/2018   Lab Results  Component Value Date   WBC 6.2 11/02/2018   HGB 15.2 11/02/2018   HCT 43.9 11/02/2018   MCV 88.2 11/02/2018   PLT 259.0 11/02/2018   Lab Results  Component Value Date   NA 138 11/02/2018   K 3.4 (L) 11/02/2018   CO2 27 11/02/2018   GLUCOSE 108 (H) 11/02/2018   BUN 15 11/02/2018   CREATININE 1.11 11/02/2018   BILITOT 1.0 11/02/2018   ALKPHOS 64 11/02/2018   AST 14 11/02/2018   ALT 12 11/02/2018   PROT 7.5 11/02/2018   ALBUMIN 4.7 11/02/2018   CALCIUM 9.9 11/02/2018   ANIONGAP 6 09/21/2018   GFR 79.32 11/02/2018   Lab Results  Component Value Date   CHOL 192 11/02/2018   Lab Results  Component Value Date   HDL 39.00 (L) 11/02/2018   Lab Results  Component Value Date   LDLCALC 135 (H) 11/02/2018   Lab Results  Component Value Date   TRIG 88.0 11/02/2018   Lab Results  Component Value Date   CHOLHDL 5 11/02/2018   No results found for: HGBA1C    Assessment & Plan:   Problem List Items Addressed This Visit      Other   Depression with anxiety - Primary   Relevant Medications   buPROPion (WELLBUTRIN) 75 MG tablet    Other Visit Diagnoses    Medication side effect       Relevant Orders   Magnesium      Meds ordered this encounter  Medications  . buPROPion (WELLBUTRIN)  75 MG tablet    Sig: Take one pill each morning for 2 weeks and then take a second pill mid afternoon.    Dispense:  60 tablet    Refill:  1  Follow-up: Return in about 5 weeks (around 01/20/2019), or if symptoms worsen or fail to improve.  Will try Wellbutrin.  We will go low and slow with it.  He will take 75 mg each morning for 2 weeks and then take a second pill midafternoon.  Suggested that he go ahead and arrange follow-up in [place].  Mliss Sax, MD

## 2018-12-16 NOTE — Patient Instructions (Signed)
Bupropion tablets (Depression/Mood Disorders)  What is this medicine?  BUPROPION (byoo PROE pee on) is used to treat depression.  This medicine may be used for other purposes; ask your health care provider or pharmacist if you have questions.  COMMON BRAND NAME(S): Wellbutrin  What should I tell my health care provider before I take this medicine?  They need to know if you have any of these conditions:  -an eating disorder, such as anorexia or bulimia  -bipolar disorder or psychosis  -diabetes or high blood sugar, treated with medication  -glaucoma  -heart disease, previous heart attack, or irregular heart beat  -head injury or brain tumor  -high blood pressure  -kidney or liver disease  -seizures  -suicidal thoughts or a previous suicide attempt  -Tourette's syndrome  -weight loss  -an unusual or allergic reaction to bupropion, other medicines, foods, dyes, or preservatives  -breast-feeding  -pregnant or trying to become pregnant  How should I use this medicine?  Take this medicine by mouth with a glass of water. Follow the directions on the prescription label. You can take it with or without food. If it upsets your stomach, take it with food. Take your medicine at regular intervals. Do not take your medicine more often than directed. Do not stop taking this medicine suddenly except upon the advice of your doctor. Stopping this medicine too quickly may cause serious side effects or your condition may worsen.  A special MedGuide will be given to you by the pharmacist with each prescription and refill. Be sure to read this information carefully each time.  Talk to your pediatrician regarding the use of this medicine in children. Special care may be needed.  Overdosage: If you think you have taken too much of this medicine contact a poison control center or emergency room at once.  NOTE: This medicine is only for you. Do not share this medicine with others.  What if I miss a dose?  If you miss a dose, take it as soon  as you can. If it is less than four hours to your next dose, take only that dose and skip the missed dose. Do not take double or extra doses.  What may interact with this medicine?  Do not take this medicine with any of the following medications:  -linezolid  -MAOIs like Azilect, Carbex, Eldepryl, Marplan, Nardil, and Parnate  -methylene blue (injected into a vein)  -other medicines that contain bupropion like Zyban  This medicine may also interact with the following medications:  -alcohol  -certain medicines for anxiety or sleep  -certain medicines for blood pressure like metoprolol, propranolol  -certain medicines for depression or psychotic disturbances  -certain medicines for HIV or AIDS like efavirenz, lopinavir, nelfinavir, ritonavir  -certain medicines for irregular heart beat like propafenone, flecainide  -certain medicines for Parkinson's disease like amantadine, levodopa  -certain medicines for seizures like carbamazepine, phenytoin, phenobarbital  -cimetidine  -clopidogrel  -cyclophosphamide  -digoxin  -furazolidone  -isoniazid  -nicotine  -orphenadrine  -procarbazine  -steroid medicines like prednisone or cortisone  -stimulant medicines for attention disorders, weight loss, or to stay awake  -tamoxifen  -theophylline  -thiotepa  -ticlopidine  -tramadol  -warfarin  This list may not describe all possible interactions. Give your health care provider a list of all the medicines, herbs, non-prescription drugs, or dietary supplements you use. Also tell them if you smoke, drink alcohol, or use illegal drugs. Some items may interact with your medicine.  What should I watch for   while using this medicine?  Tell your doctor if your symptoms do not get better or if they get worse. Visit your doctor or health care professional for regular checks on your progress. Because it may take several weeks to see the full effects of this medicine, it is important to continue your treatment as prescribed by your  doctor.  Patients and their families should watch out for new or worsening thoughts of suicide or depression. Also watch out for sudden changes in feelings such as feeling anxious, agitated, panicky, irritable, hostile, aggressive, impulsive, severely restless, overly excited and hyperactive, or not being able to sleep. If this happens, especially at the beginning of treatment or after a change in dose, call your health care professional.  Avoid alcoholic drinks while taking this medicine. Drinking excessive alcoholic beverages, using sleeping or anxiety medicines, or quickly stopping the use of these agents while taking this medicine may increase your risk for a seizure.  Do not drive or use heavy machinery until you know how this medicine affects you. This medicine can impair your ability to perform these tasks.  Do not take this medicine close to bedtime. It may prevent you from sleeping.  Your mouth may get dry. Chewing sugarless gum or sucking hard candy, and drinking plenty of water may help. Contact your doctor if the problem does not go away or is severe.  What side effects may I notice from receiving this medicine?  Side effects that you should report to your doctor or health care professional as soon as possible:  -allergic reactions like skin rash, itching or hives, swelling of the face, lips, or tongue  -breathing problems  -changes in vision  -confusion  -elevated mood, decreased need for sleep, racing thoughts, impulsive behavior  -fast or irregular heartbeat  -hallucinations, loss of contact with reality  -increased blood pressure  -redness, blistering, peeling or loosening of the skin, including inside the mouth  -seizures  -suicidal thoughts or other mood changes  -unusually weak or tired  -vomiting  Side effects that usually do not require medical attention (report to your doctor or health care professional if they continue or are bothersome):  -constipation  -headache  -loss of  appetite  -nausea  -tremors  -weight loss  This list may not describe all possible side effects. Call your doctor for medical advice about side effects. You may report side effects to FDA at 1-800-FDA-1088.  Where should I keep my medicine?  Keep out of the reach of children.  Store at room temperature between 20 and 25 degrees C (68 and 77 degrees F), away from direct sunlight and moisture. Keep tightly closed. Throw away any unused medicine after the expiration date.  NOTE: This sheet is a summary. It may not cover all possible information. If you have questions about this medicine, talk to your doctor, pharmacist, or health care provider.  © 2019 [employer] (2016-03-27 13:44:21)

## 2018-12-17 LAB — MAGNESIUM: Magnesium: 2.2 mg/dL (ref 1.5–2.5)

## 2019-01-28 ENCOUNTER — Other Ambulatory Visit: Payer: Self-pay | Admitting: Family Medicine

## 2019-01-28 DIAGNOSIS — F418 Other specified anxiety disorders: Secondary | ICD-10-CM

## 2019-02-15 ENCOUNTER — Telehealth: Payer: Self-pay | Admitting: Family Medicine

## 2019-02-15 NOTE — Telephone Encounter (Signed)
Called and talked with patient. He states that he has moved and patient is seeing another doctor.

## 2020-07-12 IMAGING — CR DG ABDOMEN 2V
2 series · 2 of 2 positions shown · non-contrast
Comparison: None.

CLINICAL DATA: Nausea, constipation. History of small-bowel
obstruction.

EXAM:
ABDOMEN - 2 VIEW

[t abdomen supine]
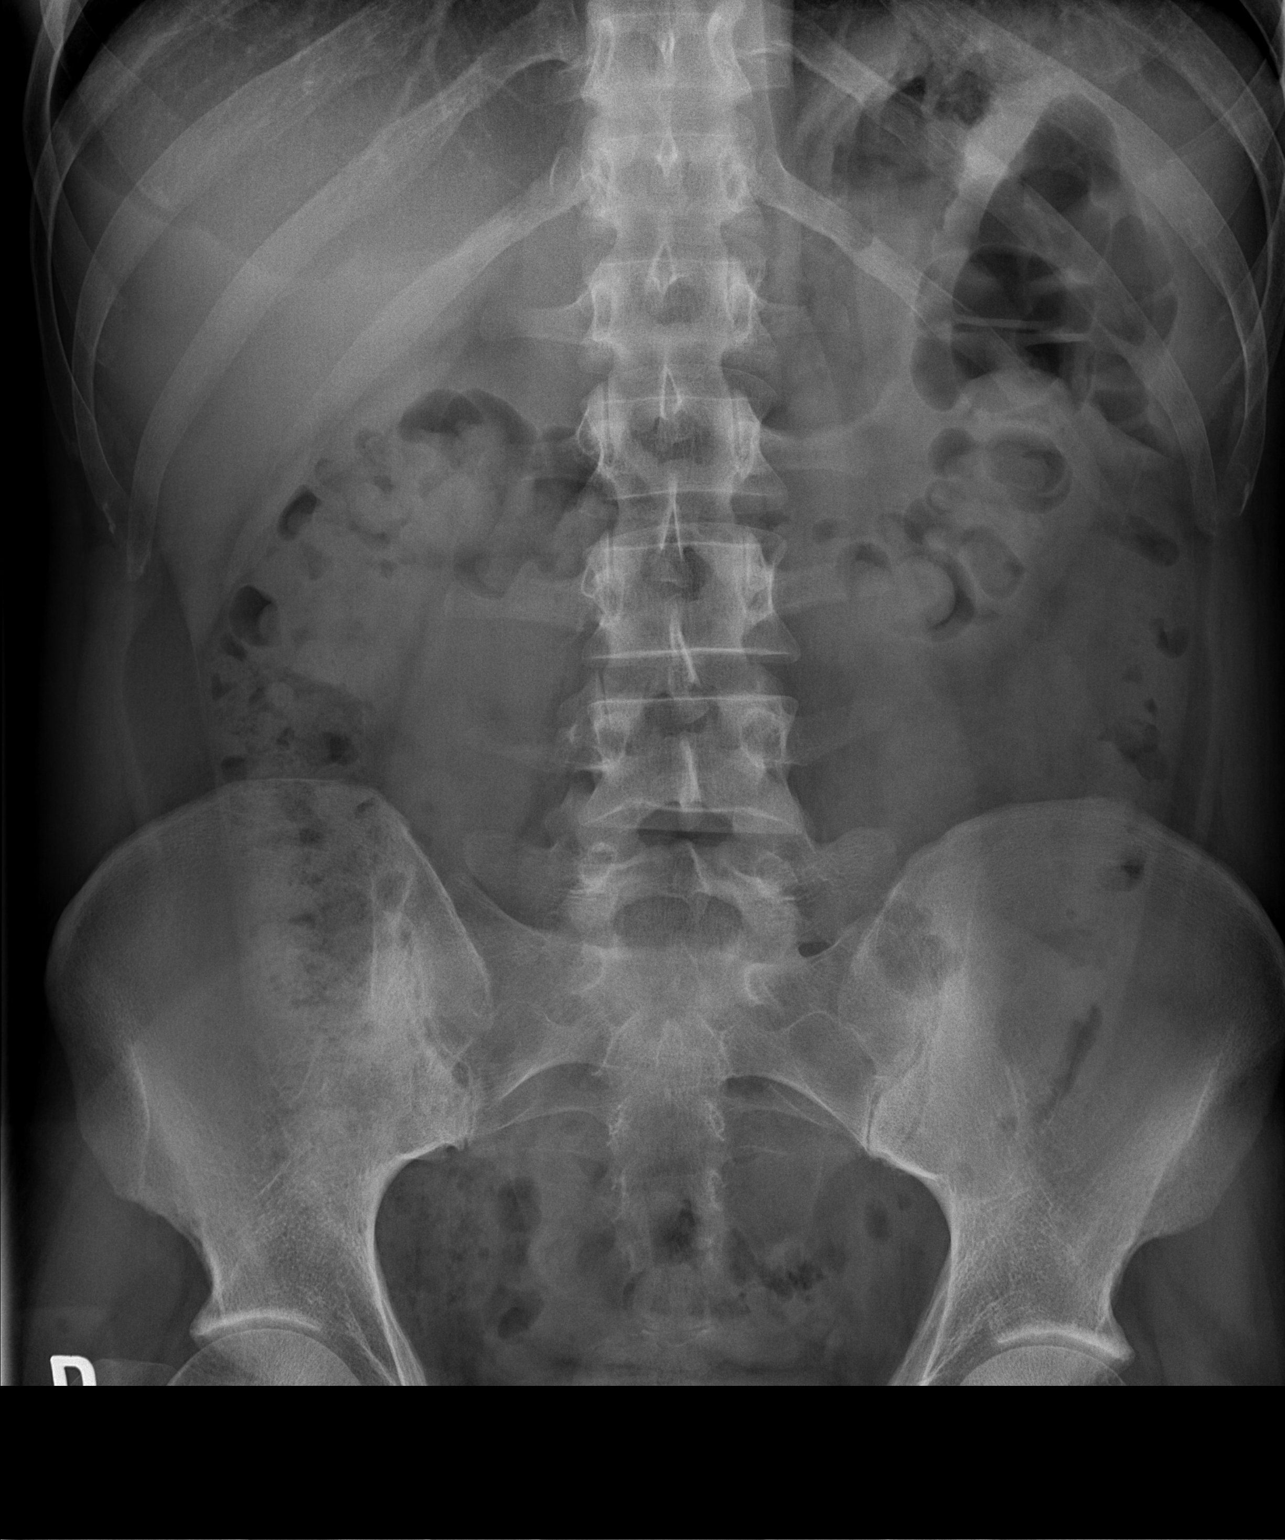

[w abdomen upright]
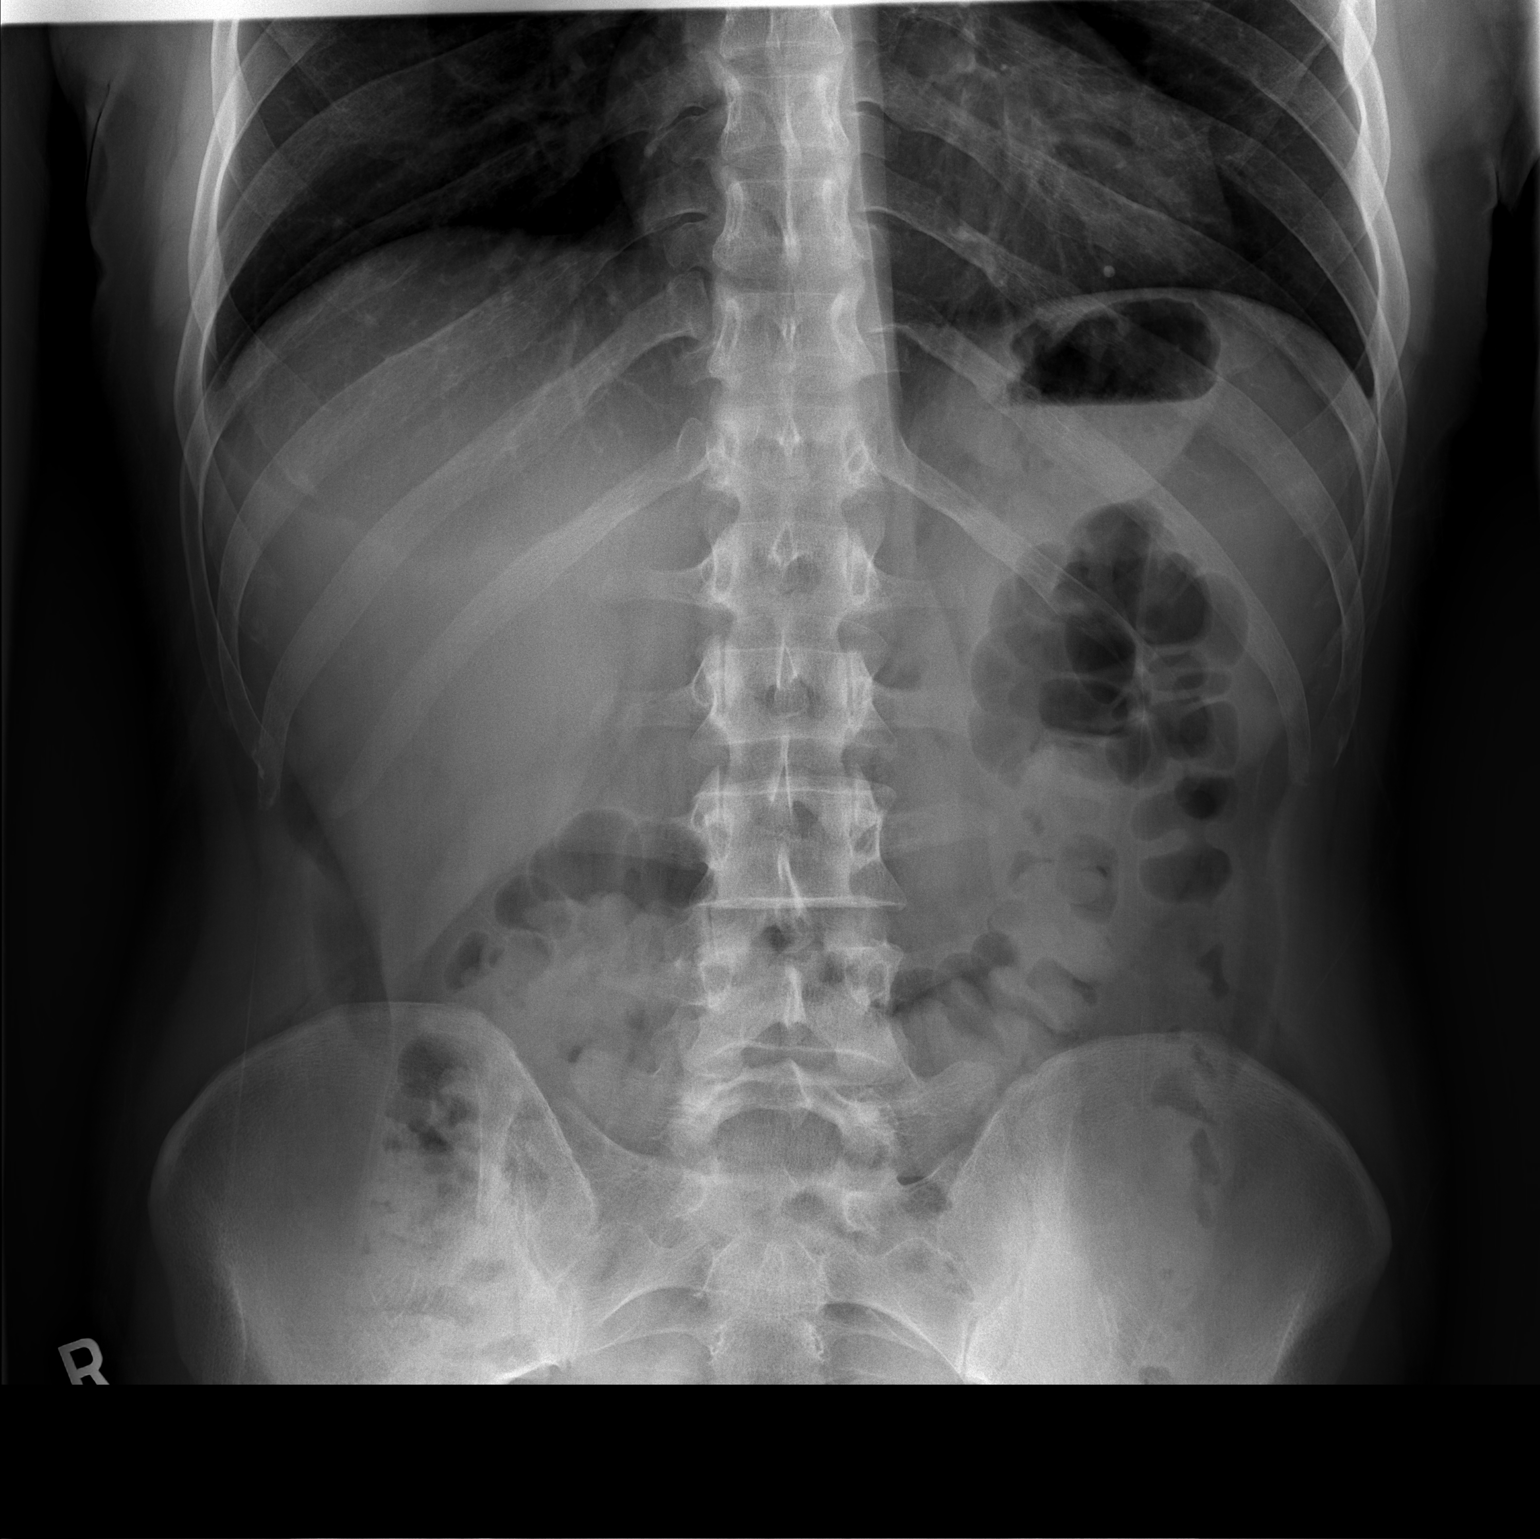

[2 of 2 positions shown; findings below may reference images not displayed]

FINDINGS: Bowel gas pattern is nonobstructive. No plain film evidence of
constipation

No evidence of soft tissue mass or abnormal fluid collection. No
evidence of free intraperitoneal air. No evidence of renal or
ureteral calculi. Lung bases appear clear. Osseous structures are
unremarkable.
IMPRESSION: Negative. Nonobstructive bowel gas pattern. No radiographic evidence
of constipation.

## 2020-07-14 IMAGING — DX DG CHEST 2V
2 series · 2 of 2 positions shown · non-contrast
Comparison: None.

CLINICAL DATA: Pt reports centralized chest pain that goes into his
stomach described as burning. Pt reports the pain woke him up and he
also has left arm pain. denies sob, dizziness or sweating. Reports
this has been going on "for years but is really bad.

EXAM:
CHEST - 2 VIEW

[chest pa]
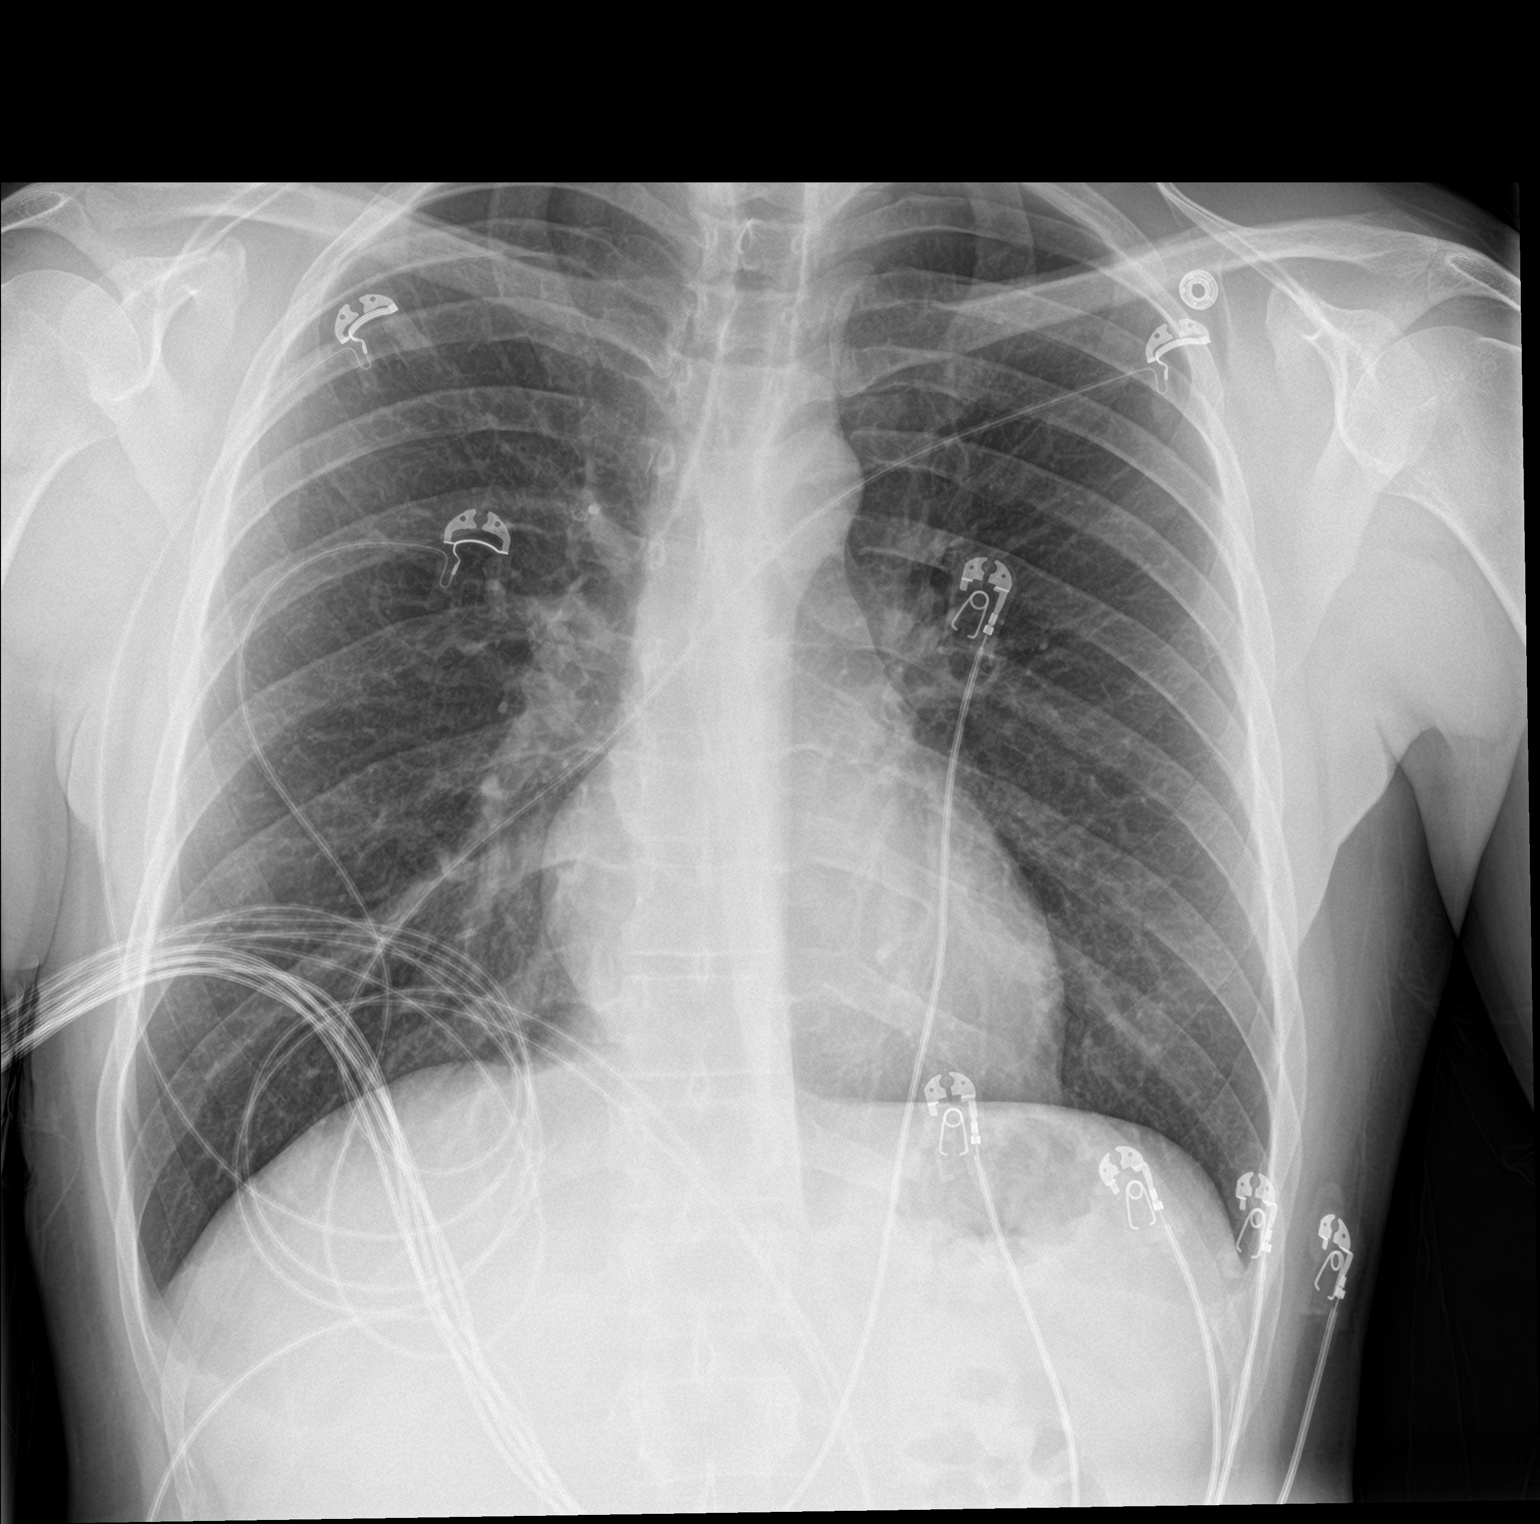

[chest lat]
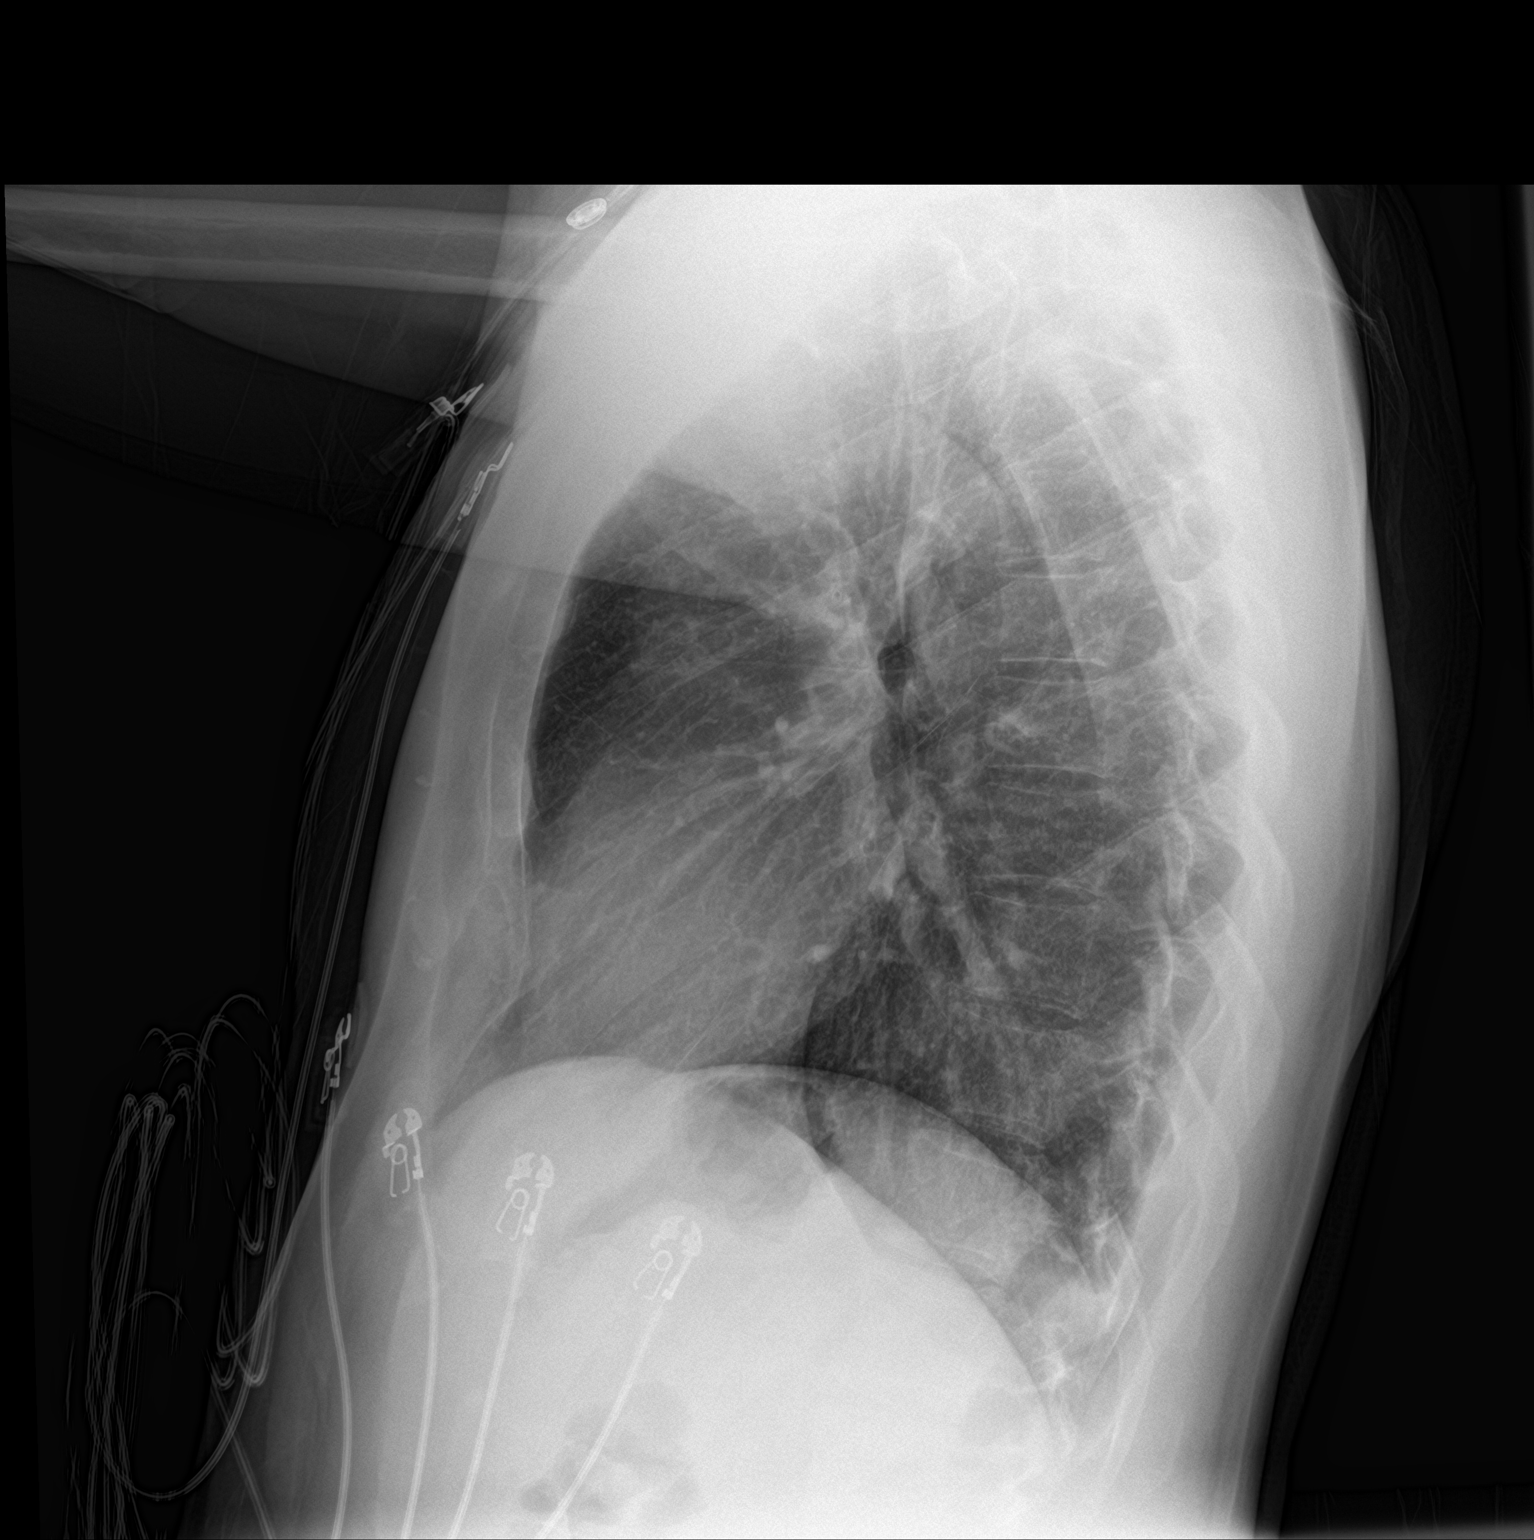

[2 of 2 positions shown; findings below may reference images not displayed]

FINDINGS: The heart size and mediastinal contours are within normal limits.
Both lungs are clear. The visualized skeletal structures are
unremarkable.
IMPRESSION: No active cardiopulmonary disease.

## 2020-08-01 IMAGING — CR DG CHEST 2V
2 series · 2 of 2 positions shown · non-contrast
Comparison: Chest radiograph dated 09/03/2018

CLINICAL DATA: 37-year-old male with chest pain.

EXAM:
CHEST - 2 VIEW

[chest pa]
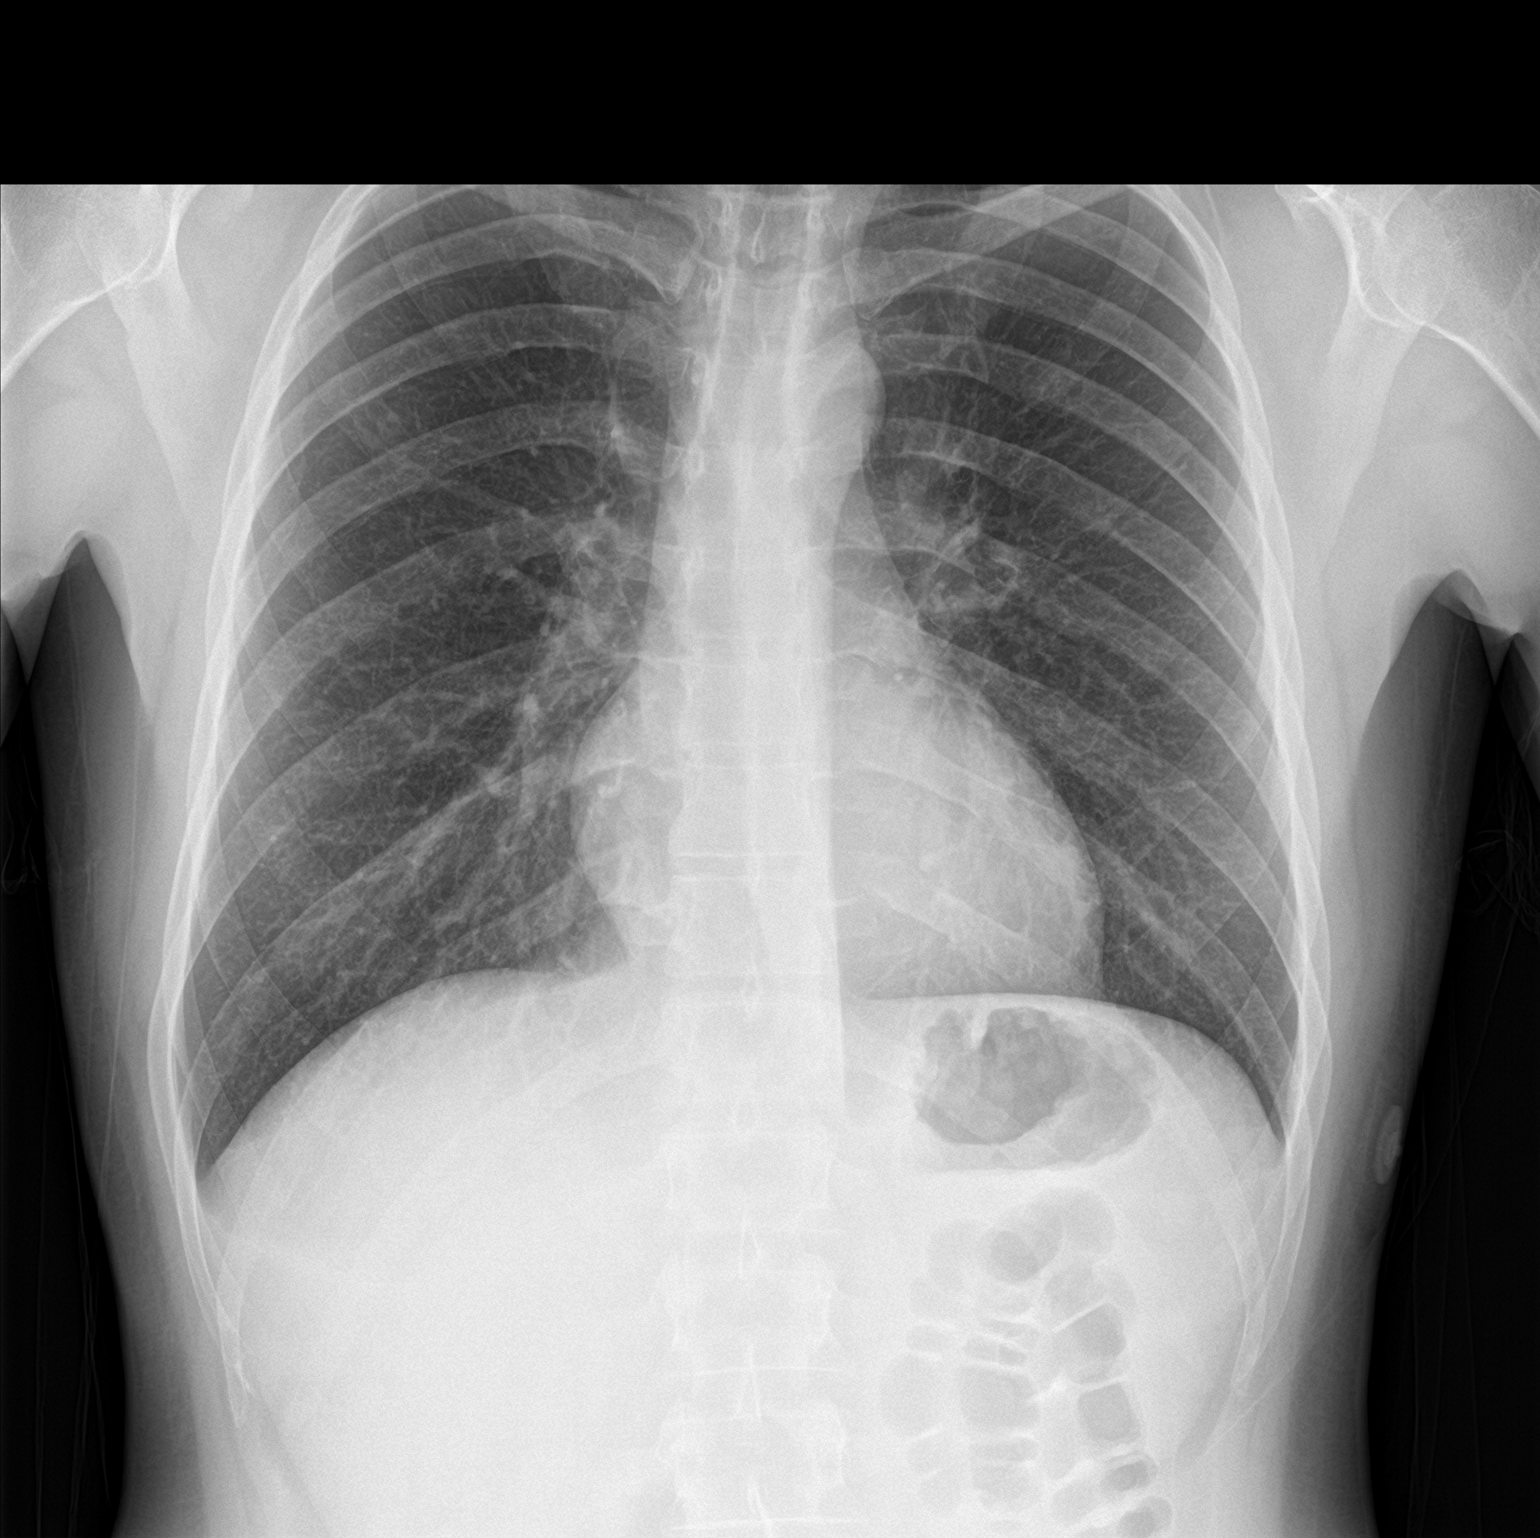

[chest lat]
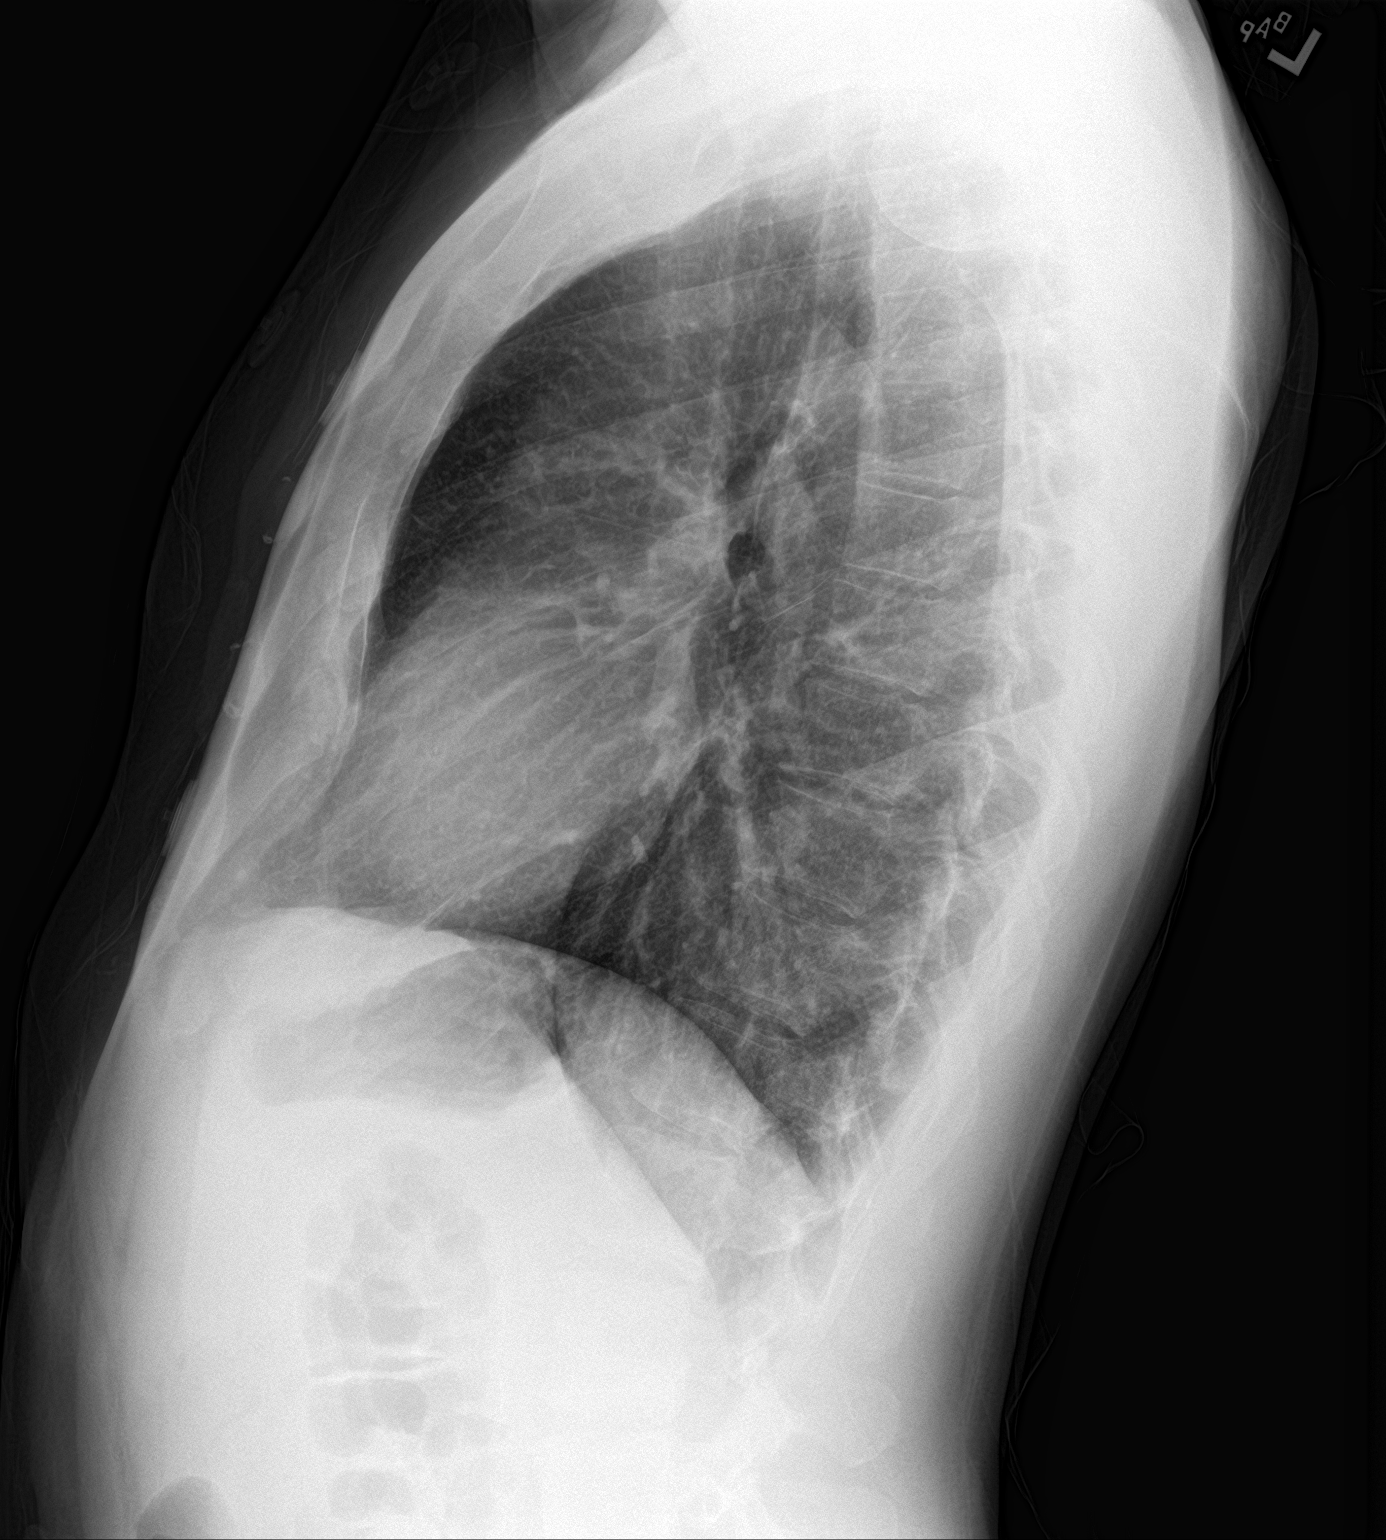

[2 of 2 positions shown; findings below may reference images not displayed]

FINDINGS: The heart size and mediastinal contours are within normal limits.
Both lungs are clear. The visualized skeletal structures are
unremarkable.
IMPRESSION: No active cardiopulmonary disease.
